# Patient Record
Sex: Female | Born: 1960
Health system: Southern US, Community
[De-identification: ages and names within clinical notes are randomized; demographics above are authoritative.]

## PROBLEM LIST (undated history)

## (undated) DIAGNOSIS — C50919 Malignant neoplasm of unspecified site of unspecified female breast: Secondary | ICD-10-CM

## (undated) DIAGNOSIS — J3800 Paralysis of vocal cords and larynx, unspecified: Secondary | ICD-10-CM

## (undated) HISTORY — DX: Paralysis of vocal cords and larynx, unspecified: J38.00

## (undated) HISTORY — PX: ROTATOR CUFF REPAIR: SHX139

## (undated) HISTORY — PX: APPENDECTOMY: SHX54

## (undated) HISTORY — PX: MASTECTOMY: SHX3

## (undated) HISTORY — DX: Malignant neoplasm of unspecified site of unspecified female breast: C50.919

---

## 1993-11-19 HISTORY — PX: ABDOMINAL HYSTERECTOMY: SHX81

## 2016-06-27 HISTORY — PX: COLONOSCOPY: SHX174

## 2016-12-13 HISTORY — PX: COLONOSCOPY: SHX174

## 2017-10-13 LAB — HM COLONOSCOPY

## 2018-05-07 DIAGNOSIS — C50919 Malignant neoplasm of unspecified site of unspecified female breast: Secondary | ICD-10-CM | POA: Diagnosis not present

## 2018-07-04 DIAGNOSIS — Z1211 Encounter for screening for malignant neoplasm of colon: Secondary | ICD-10-CM | POA: Diagnosis not present

## 2018-07-04 DIAGNOSIS — Z114 Encounter for screening for human immunodeficiency virus [HIV]: Secondary | ICD-10-CM | POA: Diagnosis not present

## 2018-07-04 DIAGNOSIS — L509 Urticaria, unspecified: Secondary | ICD-10-CM | POA: Diagnosis not present

## 2018-07-04 DIAGNOSIS — Z1159 Encounter for screening for other viral diseases: Secondary | ICD-10-CM | POA: Diagnosis not present

## 2018-07-14 DIAGNOSIS — M79675 Pain in left toe(s): Secondary | ICD-10-CM | POA: Diagnosis not present

## 2018-07-14 DIAGNOSIS — L03032 Cellulitis of left toe: Secondary | ICD-10-CM | POA: Diagnosis not present

## 2018-07-14 DIAGNOSIS — L600 Ingrowing nail: Secondary | ICD-10-CM | POA: Diagnosis not present

## 2018-07-28 DIAGNOSIS — Z1329 Encounter for screening for other suspected endocrine disorder: Secondary | ICD-10-CM | POA: Diagnosis not present

## 2018-07-28 DIAGNOSIS — Z23 Encounter for immunization: Secondary | ICD-10-CM | POA: Diagnosis not present

## 2018-07-28 DIAGNOSIS — Z1159 Encounter for screening for other viral diseases: Secondary | ICD-10-CM | POA: Diagnosis not present

## 2018-07-28 DIAGNOSIS — Z0000 Encounter for general adult medical examination without abnormal findings: Secondary | ICD-10-CM | POA: Diagnosis not present

## 2018-07-28 DIAGNOSIS — Z833 Family history of diabetes mellitus: Secondary | ICD-10-CM | POA: Diagnosis not present

## 2018-07-28 DIAGNOSIS — Z13220 Encounter for screening for lipoid disorders: Secondary | ICD-10-CM | POA: Diagnosis not present

## 2018-12-16 DIAGNOSIS — M8580 Other specified disorders of bone density and structure, unspecified site: Secondary | ICD-10-CM | POA: Diagnosis not present

## 2018-12-16 DIAGNOSIS — Z79899 Other long term (current) drug therapy: Secondary | ICD-10-CM | POA: Diagnosis not present

## 2018-12-16 DIAGNOSIS — C50912 Malignant neoplasm of unspecified site of left female breast: Secondary | ICD-10-CM | POA: Diagnosis not present

## 2018-12-16 DIAGNOSIS — Z170 Estrogen receptor positive status [ER+]: Secondary | ICD-10-CM | POA: Diagnosis not present

## 2018-12-16 DIAGNOSIS — C50919 Malignant neoplasm of unspecified site of unspecified female breast: Secondary | ICD-10-CM | POA: Diagnosis not present

## 2018-12-17 DIAGNOSIS — Z85828 Personal history of other malignant neoplasm of skin: Secondary | ICD-10-CM | POA: Diagnosis not present

## 2018-12-17 DIAGNOSIS — Z808 Family history of malignant neoplasm of other organs or systems: Secondary | ICD-10-CM | POA: Diagnosis not present

## 2018-12-17 DIAGNOSIS — L578 Other skin changes due to chronic exposure to nonionizing radiation: Secondary | ICD-10-CM | POA: Diagnosis not present

## 2018-12-17 DIAGNOSIS — L570 Actinic keratosis: Secondary | ICD-10-CM | POA: Diagnosis not present

## 2019-01-01 DIAGNOSIS — C50919 Malignant neoplasm of unspecified site of unspecified female breast: Secondary | ICD-10-CM | POA: Diagnosis not present

## 2019-01-01 DIAGNOSIS — M8589 Other specified disorders of bone density and structure, multiple sites: Secondary | ICD-10-CM | POA: Diagnosis not present

## 2019-01-01 DIAGNOSIS — Z853 Personal history of malignant neoplasm of breast: Secondary | ICD-10-CM | POA: Diagnosis not present

## 2019-01-06 DIAGNOSIS — C50919 Malignant neoplasm of unspecified site of unspecified female breast: Secondary | ICD-10-CM | POA: Diagnosis not present

## 2019-01-06 DIAGNOSIS — M8588 Other specified disorders of bone density and structure, other site: Secondary | ICD-10-CM | POA: Diagnosis not present

## 2019-03-24 ENCOUNTER — Emergency Department
Admission: EM | Admit: 2019-03-24 | Discharge: 2019-03-24 | Disposition: A | Discharge status: Home / Self Care | Payer: BLUE CROSS/BLUE SHIELD | Attending: Emergency Medicine | Admitting: Emergency Medicine

## 2019-03-24 ENCOUNTER — Other Ambulatory Visit: Payer: Self-pay

## 2019-03-24 ENCOUNTER — Encounter: Payer: Self-pay | Admitting: Emergency Medicine

## 2019-03-24 ENCOUNTER — Emergency Department: Payer: BLUE CROSS/BLUE SHIELD

## 2019-03-24 DIAGNOSIS — S93491A Sprain of other ligament of right ankle, initial encounter: Secondary | ICD-10-CM

## 2019-03-24 DIAGNOSIS — S99921A Unspecified injury of right foot, initial encounter: Secondary | ICD-10-CM | POA: Diagnosis not present

## 2019-03-24 DIAGNOSIS — Y999 Unspecified external cause status: Secondary | ICD-10-CM | POA: Diagnosis not present

## 2019-03-24 DIAGNOSIS — X501XXA Overexertion from prolonged static or awkward postures, initial encounter: Secondary | ICD-10-CM | POA: Diagnosis not present

## 2019-03-24 DIAGNOSIS — Y929 Unspecified place or not applicable: Secondary | ICD-10-CM | POA: Diagnosis not present

## 2019-03-24 DIAGNOSIS — Y939 Activity, unspecified: Secondary | ICD-10-CM | POA: Insufficient documentation

## 2019-03-24 DIAGNOSIS — S93432A Sprain of tibiofibular ligament of left ankle, initial encounter: Secondary | ICD-10-CM | POA: Diagnosis not present

## 2019-03-24 MED ORDER — MELOXICAM 15 MG PO TABS: 15.0000 mg | ORAL_TABLET | Freq: Every day | ORAL | 0 refills | Status: DC

## 2019-03-24 NOTE — ED Notes (Signed)
See triage note  Presents with pain to right foot   Stats she tripped earlier today  Having pain across the top of foot  States she took IBU PTA and states pain has eased off slightly  But unable to bear wt

## 2019-03-24 NOTE — ED Triage Notes (Signed)
PT states she tripped outside while walking and c/o RT foot pain. No deformity noted. PT denies falling.

## 2019-03-24 NOTE — ED Provider Notes (Signed)
[facility] Emergency Department Provider Note  ____________________________________________  Time seen: Approximately 8:03 PM  I have reviewed the triage vital signs and the nursing notes.   HISTORY  Chief Complaint No chief complaint on file.    HPI Alice Cherry is a 58 y.o. female who presents the emergency department complaining of right foot pain.  Patient presented to the emergency department after rolling her ankle.  She reports that she is wearing a pair flip-flops that were worn out, she believes that she caught the end of the foot flop, rolling her ankle.  Initially, patient did not have any complaints but when she got home, she had increasing pain to the right lateral foot.  Pain became severe enough that she was unable to bear weight.  She took 800 mg of ibuprofen prior to arrival and states that at this time her foot feels much better.  No numbness or tingling.  No other injury or complaint at this time.         History reviewed. No pertinent past medical history.  There are no active problems to display for this patient.   Past Surgical History:  Procedure Laterality Date  . MASTECTOMY      Prior to Admission medications   Medication Sig Start Date End Date Taking? Authorizing Provider  meloxicam (MOBIC) 15 MG tablet Take 1 tablet (15 mg total) by mouth daily. 03/24/19   , Charline Bills, PA-C    Allergies Penicillins  No family history on file.  Social History Social History   Tobacco Use  . Smoking status: Never Smoker  . Smokeless tobacco: Never Used  Substance Use Topics  . Alcohol use: Not Currently  . Drug use: Not Currently     Review of Systems  Constitutional: No fever/chills Eyes: No visual changes. No discharge ENT: No upper respiratory complaints. Cardiovascular: no chest pain. Respiratory: no cough. No SOB. Musculoskeletal: Positive for right foot pain/injury Skin: Negative for rash, abrasions,  lacerations, ecchymosis. Neurological: Negative for headaches, focal weakness or numbness. 10-point ROS otherwise negative.  ____________________________________________   PHYSICAL EXAM:  VITAL SIGNS: ED Triage Vitals  Enc Vitals Group     BP 03/24/19 1844 119/66     Pulse Rate 03/24/19 1844 76     Resp 03/24/19 1844 20     Temp 03/24/19 1844 97.8 F (36.6 C)     Temp Source 03/24/19 1844 Oral     SpO2 03/24/19 1844 96 %     Weight --      Height --      Head Circumference --      Peak Flow --      Pain Score 03/24/19 1845 8     Pain Loc --      Pain Edu? --      Excl. in [place]? --      Constitutional: Alert and oriented. Well appearing and in no acute distress. Eyes: Conjunctivae are normal. PERRL. EOMI. Head: Atraumatic. Neck: No stridor.    Cardiovascular: Normal rate, regular rhythm. Normal S1 and S2.  Good peripheral circulation. Respiratory: Normal respiratory effort without tachypnea or retractions. Lungs CTAB. Good air entry to the bases with no decreased or absent breath sounds. Musculoskeletal: Full range of motion to all extremities. No gross deformities appreciated.  Visualization of the right foot reveals mild edema to the lateral aspect over the fourth and fifth metatarsal head.  No laceration, abrasions, ecchymosis.  Patient is able to flex and extend the foot appropriately  at this time.  Mild tenderness to palpation of the proximal fourth and fifth metatarsal with no palpable abnormality or deficit.  Sensation intact all 5 digits.  Capillary refill less than 2 seconds all digits. Neurologic:  Normal speech and language. No gross focal neurologic deficits are appreciated.  Skin:  Skin is warm, dry and intact. No rash noted. Psychiatric: Mood and affect are normal. Speech and behavior are normal. Patient exhibits appropriate insight and judgement.   ____________________________________________   LABS (all labs ordered are listed, but only abnormal results are  displayed)  Labs Reviewed - No data to display ____________________________________________  EKG   ____________________________________________  RADIOLOGY I personally viewed and evaluated these images as part of my medical decision making, as well as reviewing the written report by the radiologist.  Dg Foot Complete Right  Result Date: 03/24/2019 CLINICAL DATA:  Fall EXAM: RIGHT FOOT COMPLETE - 3+ VIEW COMPARISON:  None FINDINGS: There is no evidence of fracture or dislocation. There is no evidence of arthropathy or other focal bone abnormality. Soft tissues are unremarkable. IMPRESSION: Negative. Electronically Signed   By: Rolm Baptise M.D.   On: 03/24/2019 19:35    ____________________________________________    PROCEDURES  Procedure(s) performed:    Procedures    Medications - No data to display   ____________________________________________   INITIAL IMPRESSION / ASSESSMENT AND PLAN / ED COURSE  Pertinent labs & imaging results that were available during my care of the patient were reviewed by me and considered in my medical decision making (see chart for details).  Review of the [place] CSRS was performed in accordance of the [place] prior to dispensing any controlled drugs.           Patient's diagnosis is consistent with sprain right anterior talofibular ligament.  Patient presents emergency department with complaint of pain to the right foot after injury while walking.  Exam was reassuring.  No evidence of fracture on imaging.  Follow-up with orthopedics if needed.  Patient will be prescribed meloxicam.  Her ankle is wrapped with Ace bandage and she is given crutches for ambulation. Patient is given ED precautions to return to the ED for any worsening or new symptoms.     ____________________________________________  FINAL CLINICAL IMPRESSION(S) / ED DIAGNOSES  Final diagnoses:  Sprain of anterior talofibular ligament of right ankle, initial encounter       NEW MEDICATIONS STARTED DURING THIS VISIT:  ED Discharge Orders         Ordered    meloxicam (MOBIC) 15 MG tablet  Daily     03/24/19 2019              This chart was dictated using voice recognition software/Dragon. Despite best efforts to proofread, errors can occur which can change the meaning. Any change was purely unintentional.    Darletta Moll, PA-C 03/24/19 2020    Nance Pear, MD 03/24/19 2212

## 2019-06-16 DIAGNOSIS — M8580 Other specified disorders of bone density and structure, unspecified site: Secondary | ICD-10-CM | POA: Diagnosis not present

## 2019-06-16 DIAGNOSIS — C50912 Malignant neoplasm of unspecified site of left female breast: Secondary | ICD-10-CM | POA: Diagnosis not present

## 2019-06-16 DIAGNOSIS — C50919 Malignant neoplasm of unspecified site of unspecified female breast: Secondary | ICD-10-CM | POA: Diagnosis not present

## 2019-06-16 DIAGNOSIS — Z9012 Acquired absence of left breast and nipple: Secondary | ICD-10-CM | POA: Diagnosis not present

## 2019-06-26 DIAGNOSIS — H1030 Unspecified acute conjunctivitis, unspecified eye: Secondary | ICD-10-CM | POA: Diagnosis not present

## 2019-07-21 DIAGNOSIS — Z20828 Contact with and (suspected) exposure to other viral communicable diseases: Secondary | ICD-10-CM | POA: Diagnosis not present

## 2019-09-07 DIAGNOSIS — Z20828 Contact with and (suspected) exposure to other viral communicable diseases: Secondary | ICD-10-CM | POA: Diagnosis not present

## 2019-11-09 DIAGNOSIS — D225 Melanocytic nevi of trunk: Secondary | ICD-10-CM | POA: Diagnosis not present

## 2019-11-09 DIAGNOSIS — D485 Neoplasm of uncertain behavior of skin: Secondary | ICD-10-CM | POA: Diagnosis not present

## 2019-11-09 DIAGNOSIS — D2261 Melanocytic nevi of right upper limb, including shoulder: Secondary | ICD-10-CM | POA: Diagnosis not present

## 2019-11-09 DIAGNOSIS — C44529 Squamous cell carcinoma of skin of other part of trunk: Secondary | ICD-10-CM | POA: Diagnosis not present

## 2019-11-09 DIAGNOSIS — D2262 Melanocytic nevi of left upper limb, including shoulder: Secondary | ICD-10-CM | POA: Diagnosis not present

## 2019-11-09 DIAGNOSIS — D2272 Melanocytic nevi of left lower limb, including hip: Secondary | ICD-10-CM | POA: Diagnosis not present

## 2019-12-03 DIAGNOSIS — K529 Noninfective gastroenteritis and colitis, unspecified: Secondary | ICD-10-CM | POA: Diagnosis not present

## 2019-12-03 DIAGNOSIS — Z1152 Encounter for screening for COVID-19: Secondary | ICD-10-CM | POA: Diagnosis not present

## 2019-12-22 DIAGNOSIS — C44529 Squamous cell carcinoma of skin of other part of trunk: Secondary | ICD-10-CM | POA: Diagnosis not present

## 2020-01-27 ENCOUNTER — Ambulatory Visit (INDEPENDENT_AMBULATORY_CARE_PROVIDER_SITE_OTHER): Payer: BC Managed Care – PPO | Admitting: Primary Care

## 2020-01-27 ENCOUNTER — Encounter: Payer: Self-pay | Admitting: Primary Care

## 2020-01-27 ENCOUNTER — Other Ambulatory Visit: Payer: Self-pay

## 2020-01-27 VITALS — BP 120/78 | HR 82 | Temp 96.5°F | Ht 67.5 in | Wt 173.2 lb

## 2020-01-27 DIAGNOSIS — F4321 Adjustment disorder with depressed mood: Secondary | ICD-10-CM

## 2020-01-27 DIAGNOSIS — F5105 Insomnia due to other mental disorder: Secondary | ICD-10-CM

## 2020-01-27 HISTORY — DX: Adjustment disorder with depressed mood: F43.21

## 2020-01-27 MED ORDER — FLUOXETINE HCL 20 MG PO TABS: 20.0000 mg | ORAL_TABLET | Freq: Every day | ORAL | 1 refills | Status: DC

## 2020-01-27 NOTE — Assessment & Plan Note (Signed)
Acute and secondary to the unexpected loss of her young son in late January 2021. Discussed options for treatment including therapy vs medication.  She kindly declines therapy as she feels she has a good support system. Rx for fluoxetine 20 mg sent to pharmacy. Patient is to take 1/2 tablet daily for 8 days, then advance to 1 full tablet thereafter. We discussed possible side effects of headache, GI upset, drowsiness, and SI/HI. If thoughts of SI/HI develop, we discussed to present to the emergency immediately. Patient verbalized understanding.   Follow up in 6 weeks for re-evaluation.

## 2020-01-27 NOTE — Assessment & Plan Note (Signed)
Acute since the unexpected loss of her young son in late January 2021.   Zzzquil will interact with fluoxetine so I recommended either Melatonin or Unisom.   Consider low dose Trazodone if needed.

## 2020-01-27 NOTE — Progress Notes (Signed)
Subjective:    Patient ID: Alice Cherry, female    DOB: 12/22/1960, 59 y.o.   MRN: [id number]  HPI  This visit occurred during the SARS-CoV-2 public health emergency.  Safety protocols were in place, including screening questions prior to the visit, additional usage of staff PPE, and extensive cleaning of exam room while observing appropriate contact time as indicated for disinfecting solutions.   Ms. Alice Cherry is a 59 year old female who presents today to establish care and discuss the problems mentioned below. Will obtain/review records.  1) Breast Cancer: Diagnosed to the left breast in 2017, mastectomy in 2017, following with oncology through [facility]. Completes annual mammograms. Never underwent chemotherapy or radiation. Has a complication of left sided lymphadenopathy.   2) Osteopenia: Currently managed on Calcium and Vitamin D. Last bone density scan was several years ago through [place].   BP Readings from Last 3 Encounters:  01/27/20 120/78  03/24/19 119/66   3) Adjustment reaction with Depression: Acute since the unexpected loss of her son in January 2021. Symptoms include tearfulness, little motivation to get out of bed, having difficulty getting work done, being more absent minded, fatigue, having trouble making dinner, insomnia. She is taking Zzzquil nightly to stay asleep. She has no trouble falling asleep.   No prior history of anxiety and depression. She has an excellent support system with her husband and close friends. Not currently following with therapy, no current medication treatment.   Review of Systems  Respiratory: Negative for shortness of breath.   Cardiovascular: Negative for chest pain.  Psychiatric/Behavioral: Positive for sleep disturbance. Negative for suicidal ideas. The patient is not nervous/anxious.        See HPI       No past medical history on file.   Social History   Socioeconomic History  . Marital status: Married    Spouse name: Not on file  .  Number of children: Not on file  . Years of education: Not on file  . Highest education level: Not on file  Occupational History  . Not on file  Tobacco Use  . Smoking status: Never Smoker  . Smokeless tobacco: Never Used  Substance and Sexual Activity  . Alcohol use: Not Currently  . Drug use: Not Currently  . Sexual activity: Not on file  Other Topics Concern  . Not on file  Social History Narrative  . Not on file   Social Determinants of Health   Financial Resource Strain:   . Difficulty of Paying Living Expenses: Not on file  Food Insecurity:   . Worried About Charity fundraiser in the Last Year: Not on file  . Ran Out of Food in the Last Year: Not on file  Transportation Needs:   . Lack of Transportation (Medical): Not on file  . Lack of Transportation (Non-Medical): Not on file  Physical Activity:   . Days of Exercise per Week: Not on file  . Minutes of Exercise per Session: Not on file  Stress:   . Feeling of Stress : Not on file  Social Connections:   . Frequency of Communication with Friends and Family: Not on file  . Frequency of Social Gatherings with Friends and Family: Not on file  . Attends Religious Services: Not on file  . Active Member of Clubs or Organizations: Not on file  . Attends Archivist Meetings: Not on file  . Marital Status: Not on file  Intimate Partner Violence:   . Fear of  Current or Ex-Partner: Not on file  . Emotionally Abused: Not on file  . Physically Abused: Not on file  . Sexually Abused: Not on file    Past Surgical History:  Procedure Laterality Date  . ABDOMINAL HYSTERECTOMY  1995  . APPENDECTOMY    . MASTECTOMY      Family History  Problem Relation Age of Onset  . Diabetes Mother   . Alzheimer's disease Mother   . Skin cancer Father   . Cancer Father        multiple myeloma    Allergies  Allergen Reactions  . Penicillins     No current outpatient medications on file prior to visit.   No current  facility-administered medications on file prior to visit.    BP 120/78   Pulse 82   Temp (!) 96.5 F (35.8 C) (Temporal)   Ht 5' 7.5" (1.715 m)   Wt 173 lb 4 oz (78.6 kg)   SpO2 98%   BMI 26.73 kg/m    Objective:   Physical Exam  Constitutional: She appears well-nourished.  Cardiovascular: Normal rate and regular rhythm.  Respiratory: Effort normal and breath sounds normal.  Musculoskeletal:     Cervical back: Neck supple.  Skin: Skin is warm and dry.  Psychiatric: She has a normal mood and affect.           Assessment & Plan:

## 2020-01-27 NOTE — Patient Instructions (Addendum)
Start fluoxetine (Prozac) 20 mg tablets. Start by taking 1/2 tablet daily for 6-8 days, then increase to 1 full tablet thereafter.   Consider trying Unisom or Melatonin (max dose 10 mg) for sleep.  Please schedule a follow up visit for 6 weeks as discussed.   It was a pleasure to meet you today! Please don't hesitate to call or message me with any questions. Welcome to [facility]!

## 2020-02-26 DIAGNOSIS — Z79899 Other long term (current) drug therapy: Secondary | ICD-10-CM | POA: Diagnosis not present

## 2020-02-26 DIAGNOSIS — Z9012 Acquired absence of left breast and nipple: Secondary | ICD-10-CM | POA: Diagnosis not present

## 2020-02-26 DIAGNOSIS — Z08 Encounter for follow-up examination after completed treatment for malignant neoplasm: Secondary | ICD-10-CM | POA: Diagnosis not present

## 2020-02-26 DIAGNOSIS — M8589 Other specified disorders of bone density and structure, multiple sites: Secondary | ICD-10-CM | POA: Diagnosis not present

## 2020-02-26 DIAGNOSIS — Z853 Personal history of malignant neoplasm of breast: Secondary | ICD-10-CM | POA: Diagnosis not present

## 2020-02-26 DIAGNOSIS — C50919 Malignant neoplasm of unspecified site of unspecified female breast: Secondary | ICD-10-CM | POA: Diagnosis not present

## 2020-03-09 ENCOUNTER — Ambulatory Visit (INDEPENDENT_AMBULATORY_CARE_PROVIDER_SITE_OTHER): Payer: BC Managed Care – PPO | Admitting: Primary Care

## 2020-03-09 ENCOUNTER — Other Ambulatory Visit: Payer: Self-pay

## 2020-03-09 ENCOUNTER — Encounter: Payer: Self-pay | Admitting: Primary Care

## 2020-03-09 DIAGNOSIS — F5105 Insomnia due to other mental disorder: Secondary | ICD-10-CM

## 2020-03-09 DIAGNOSIS — F4321 Adjustment disorder with depressed mood: Secondary | ICD-10-CM | POA: Diagnosis not present

## 2020-03-09 NOTE — Assessment & Plan Note (Signed)
Improved on fluoxetine, could not tolerate 20 mg dose due to feeling too jittery, but crashing in the afternoon.  Recommended to try fluoxetine 10 mg BID. She will try this for a few weeks and then update via My Chart.  Consider switching to Lexapro if needed.

## 2020-03-09 NOTE — Progress Notes (Signed)
Subjective:    Patient ID: Alice Cherry, female    DOB: September 03, 1961, 59 y.o.   MRN: [id number]  HPI  This visit occurred during the SARS-CoV-2 public health emergency.  Safety protocols were in place, including screening questions prior to the visit, additional usage of staff PPE, and extensive cleaning of exam room while observing appropriate contact time as indicated for disinfecting solutions.   Alice Cherry is a 59 year old female who presents today for follow up of acute depression. She was last evaluated on 01/27/20 as a new patient with reports of acute symptoms of depression since the unexpected loss of her son in January 2021. No prior history of anxiety or depression. Given her symptoms and inability to manage we initiated fluoxetine 20 mg. We also recommended Melatonin or Unisom for sleeping.   Since her last visit she's feeling better. Positive effects of fluoxetine include increased energy, better control of emotions. She could not tolerate the full 20 mg tablet of fluoxetine due to "feeling intense/jittery", so she is taking 1/2 tablet and doing better. She did go without the medication for 3 days and realized that she felt better on the smaller dose.   She has noticed that she is "crashing" during the afternoon, and will come home very tired. Given this she is not taking anything OTC for insomnia as her sleeping is better. Takes Unisom once weekly if needed.  BP Readings from Last 3 Encounters:  03/09/20 122/80  01/27/20 120/78  03/24/19 119/66     Review of Systems  Cardiovascular: Negative for chest pain.  Neurological: Negative for dizziness and headaches.  Psychiatric/Behavioral:       See HPI       No past medical history on file.   Social History   Socioeconomic History  . Marital status: Married    Spouse name: Not on file  . Number of children: Not on file  . Years of education: Not on file  . Highest education level: Not on file  Occupational History  .  Not on file  Tobacco Use  . Smoking status: Never Smoker  . Smokeless tobacco: Never Used  Substance and Sexual Activity  . Alcohol use: Not Currently  . Drug use: Not Currently  . Sexual activity: Not on file  Other Topics Concern  . Not on file  Social History Narrative  . Not on file   Social Determinants of Health   Financial Resource Strain:   . Difficulty of Paying Living Expenses:   Food Insecurity:   . Worried About Charity fundraiser in the Last Year:   . Arboriculturist in the Last Year:   Transportation Needs:   . Film/video editor (Medical):   Marland Kitchen Lack of Transportation (Non-Medical):   Physical Activity:   . Days of Exercise per Week:   . Minutes of Exercise per Session:   Stress:   . Feeling of Stress :   Social Connections:   . Frequency of Communication with Friends and Family:   . Frequency of Social Gatherings with Friends and Family:   . Attends Religious Services:   . Active Member of Clubs or Organizations:   . Attends Archivist Meetings:   Marland Kitchen Marital Status:   Intimate Partner Violence:   . Fear of Current or Ex-Partner:   . Emotionally Abused:   Marland Kitchen Physically Abused:   . Sexually Abused:     Past Surgical History:  Procedure Laterality Date  .  ABDOMINAL HYSTERECTOMY  1995  . APPENDECTOMY    . MASTECTOMY      Family History  Problem Relation Age of Onset  . Diabetes Mother   . Alzheimer's disease Mother   . Skin cancer Father   . Cancer Father        multiple myeloma    Allergies  Allergen Reactions  . Penicillins     Current Outpatient Medications on File Prior to Visit  Medication Sig Dispense Refill  . FLUoxetine (PROZAC) 20 MG tablet Take 1 tablet (20 mg total) by mouth daily. (Patient taking differently: Take 10 mg by mouth daily. ) 30 tablet 1   No current facility-administered medications on file prior to visit.    BP 122/80   Pulse 73   Temp (!) 96.1 F (35.6 C) (Temporal)   SpO2 98%    Objective:     Physical Exam  Constitutional: She appears well-nourished.  Cardiovascular: Normal rate and regular rhythm.  Respiratory: Effort normal and breath sounds normal.  Musculoskeletal:     Cervical back: Neck supple.  Skin: Skin is warm and dry.  Psychiatric: She has a normal mood and affect.           Assessment & Plan:

## 2020-03-09 NOTE — Assessment & Plan Note (Signed)
Improved and using Unisom infrequently. Continue to monitor.

## 2020-03-09 NOTE — Patient Instructions (Signed)
Try taking fluoxetine 1/2 tablet twice daily as discussed.  Please update me via My Chart in 2-3 weeks.  It was a pleasure to see you today!

## 2020-03-22 ENCOUNTER — Other Ambulatory Visit: Payer: Self-pay | Admitting: Primary Care

## 2020-03-22 DIAGNOSIS — F4321 Adjustment disorder with depressed mood: Secondary | ICD-10-CM

## 2020-03-24 NOTE — Telephone Encounter (Signed)
Message left for patient to return my call.  

## 2020-03-25 NOTE — Telephone Encounter (Signed)
Left message on patient's voicemail to return call

## 2020-03-30 NOTE — Telephone Encounter (Signed)
Please advise. It has been quite a few days and patient have not reply or call office with an update

## 2020-03-30 NOTE — Telephone Encounter (Signed)
Refill provided for 10 mg BID. Also made note in the Rx for pharmacy to have patient contact us.

## 2020-03-30 NOTE — Telephone Encounter (Signed)
Message left for patient to return my call.  

## 2020-06-05 DIAGNOSIS — X501XXA Overexertion from prolonged static or awkward postures, initial encounter: Secondary | ICD-10-CM | POA: Diagnosis not present

## 2020-06-05 DIAGNOSIS — S93402A Sprain of unspecified ligament of left ankle, initial encounter: Secondary | ICD-10-CM | POA: Diagnosis not present

## 2020-06-05 DIAGNOSIS — Y9301 Activity, walking, marching and hiking: Secondary | ICD-10-CM | POA: Diagnosis not present

## 2020-06-05 DIAGNOSIS — S96912A Strain of unspecified muscle and tendon at ankle and foot level, left foot, initial encounter: Secondary | ICD-10-CM | POA: Diagnosis not present

## 2020-06-05 DIAGNOSIS — G35 Multiple sclerosis: Secondary | ICD-10-CM | POA: Diagnosis not present

## 2020-08-17 ENCOUNTER — Telehealth: Payer: Self-pay | Admitting: Primary Care

## 2020-08-17 NOTE — Telephone Encounter (Signed)
Patient unavailable left vm for her to call back to schedule a virtual visit for her refill on her medication.

## 2020-08-23 ENCOUNTER — Other Ambulatory Visit: Payer: Self-pay | Admitting: Primary Care

## 2020-08-23 DIAGNOSIS — F4321 Adjustment disorder with depressed mood: Secondary | ICD-10-CM

## 2020-08-25 NOTE — Telephone Encounter (Signed)
We have been calling several times and sent note on last script to make appointment not able to reach do you want to refill?

## 2020-08-29 ENCOUNTER — Telehealth (INDEPENDENT_AMBULATORY_CARE_PROVIDER_SITE_OTHER): Payer: BC Managed Care – PPO | Admitting: Primary Care

## 2020-08-29 ENCOUNTER — Other Ambulatory Visit: Payer: Self-pay

## 2020-08-29 ENCOUNTER — Encounter: Payer: Self-pay | Admitting: Primary Care

## 2020-08-29 DIAGNOSIS — F4321 Adjustment disorder with depressed mood: Secondary | ICD-10-CM | POA: Diagnosis not present

## 2020-08-29 NOTE — Assessment & Plan Note (Signed)
Agree to resume fluoxetine, but need clarification on which dose she's actually taking. We've gone between the 10 and 20 mg dose, so I asked her to send a message via my chart with the exact dose she's been taking.  Will send refills once we hear from her.

## 2020-08-29 NOTE — Progress Notes (Signed)
Subjective:    Patient ID: Alice Cherry, female    DOB: 05-10-1961, 59 y.o.   MRN: [id number]  HPI  Virtual Visit via Video Note  I connected with Alice Cherry on 08/29/20 at  2:00 PM EDT by a video enabled telemedicine application and verified that I am speaking with the correct person using two identifiers.  Location: Patient: Work Provider: Office   I discussed the limitations of evaluation and management by telemedicine and the availability of in person appointments. The patient expressed understanding and agreed to proceed.  History of Present Illness:  Alice Cherry is a 59 year old female who presents today for medication refill.  She is currently managed on fluoxetine 20 mg for symptoms of anxiety, insomnia, and depression since then unexpected loss of her son last year.   She was originally treated for her symptoms in early March 2021 with her last follow up being in April 2021. She sent a my chart message last week with reports of stopping her fluoxetine in June this year as she felt better, but feels that she may need to resume.  Symptoms consist of tearfulness, weight gain, inability to complete tasks, fatigue, difficulty coping.   Two weeks ago she resumed her fluoxetine medication, start taking "one half dose" in the morning, then increased to "one half dose" in the morning and evening. Now she's up to one "full dose" in the morning and one "half dose" in the evening and is doing much better.  She would like to resume at this regimen. She's unsure of the dose amount of her fluoxetine tablet.    Observations/Objective:  Alert and oriented. Appears well, not sickly. No distress. Speaking in complete sentences.   Assessment and Plan:  Agree to resume fluoxetine, but need clarification on which dose she's actually taking. We've gone between the 10 and 20 mg dose, so I asked her to send a message via my chart with the exact dose she's been taking.  Will send refills  once we hear from her.  Follow Up Instructions:  Please send me information regarding the dose of your fluoxetine medication.  It was a pleasure to see you today!    I discussed the assessment and treatment plan with the patient. The patient was provided an opportunity to ask questions and all were answered. The patient agreed with the plan and demonstrated an understanding of the instructions.   The patient was advised to call back or seek an in-person evaluation if the symptoms worsen or if the condition fails to improve as anticipated.    Pleas Koch, NP    Review of Systems  Gastrointestinal: Negative for nausea.  Neurological: Negative for headaches.  Psychiatric/Behavioral:       See HPI       History reviewed. No pertinent past medical history.   Social History   Socioeconomic History   Marital status: Married    Spouse name: Not on file   Number of children: Not on file   Years of education: Not on file   Highest education level: Not on file  Occupational History   Not on file  Tobacco Use   Smoking status: Never Smoker   Smokeless tobacco: Never Used  Substance and Sexual Activity   Alcohol use: Not Currently   Drug use: Not Currently   Sexual activity: Not on file  Other Topics Concern   Not on file  Social History Narrative   Not on file   Social Determinants  of Health   Financial Resource Strain:    Difficulty of Paying Living Expenses: Not on file  Food Insecurity:    Worried About [place] in the Last Year: Not on file   Ran Out of Food in the Last Year: Not on file  Transportation Needs:    Lack of Transportation (Medical): Not on file   Lack of Transportation (Non-Medical): Not on file  Physical Activity:    Days of Exercise per Week: Not on file   Minutes of Exercise per Session: Not on file  Stress:    Feeling of Stress : Not on file  Social Connections:    Frequency of Communication with  Friends and Family: Not on file   Frequency of Social Gatherings with Friends and Family: Not on file   Attends Religious Services: Not on file   Active Member of Clubs or Organizations: Not on file   Attends Archivist Meetings: Not on file   Marital Status: Not on file  Intimate Partner Violence:    Fear of Current or Ex-Partner: Not on file   Emotionally Abused: Not on file   Physically Abused: Not on file   Sexually Abused: Not on file    Past Surgical History:  Procedure Laterality Date   ABDOMINAL HYSTERECTOMY  1995   APPENDECTOMY     MASTECTOMY      Family History  Problem Relation Age of Onset   Diabetes Mother    Alzheimer's disease Mother    Skin cancer Father    Cancer Father        multiple myeloma    Allergies  Allergen Reactions   Penicillins     Current Outpatient Medications on File Prior to Visit  Medication Sig Dispense Refill   FLUoxetine (PROZAC) 20 MG tablet Take 0.5 tablets (10 mg total) by mouth 2 (two) times daily. 30 tablet 0   No current facility-administered medications on file prior to visit.    Ht 5' 7.5" (1.715 m)    Wt 175 lb (79.4 kg)    BMI 27.00 kg/m    Objective:   Physical Exam Pulmonary:     Effort: Pulmonary effort is normal.  Neurological:     Mental Status: She is alert and oriented to person, place, and time.  Psychiatric:        Mood and Affect: Mood normal.     Comments: Smiling during video visit            Assessment & Plan:

## 2020-08-29 NOTE — Patient Instructions (Signed)
Please send me information regarding the dose of your fluoxetine medication.  It was a pleasure to see you today!

## 2020-08-30 DIAGNOSIS — F4321 Adjustment disorder with depressed mood: Secondary | ICD-10-CM

## 2020-08-30 NOTE — Telephone Encounter (Signed)
I met with her via video this week, and asked her to verify the dose of fluoxetine that she has at home.  She has been on 10 mg in the past, as well as 20 mg.  Please verify the dose and how frequently she is taking. We will need to send refills of what she is currently taking.  90-day supply with 1 refill. 

## 2020-08-31 NOTE — Telephone Encounter (Signed)
Per my chart message she is taking 20 mg in am and 10-20 at night I have called in script for that.

## 2020-09-07 DIAGNOSIS — Z853 Personal history of malignant neoplasm of breast: Secondary | ICD-10-CM | POA: Diagnosis not present

## 2020-09-07 DIAGNOSIS — C50919 Malignant neoplasm of unspecified site of unspecified female breast: Secondary | ICD-10-CM | POA: Diagnosis not present

## 2020-09-07 DIAGNOSIS — Z9012 Acquired absence of left breast and nipple: Secondary | ICD-10-CM | POA: Diagnosis not present

## 2020-09-07 DIAGNOSIS — M8589 Other specified disorders of bone density and structure, multiple sites: Secondary | ICD-10-CM | POA: Diagnosis not present

## 2020-09-07 DIAGNOSIS — Z08 Encounter for follow-up examination after completed treatment for malignant neoplasm: Secondary | ICD-10-CM | POA: Diagnosis not present

## 2020-11-27 ENCOUNTER — Other Ambulatory Visit: Payer: Self-pay | Admitting: Primary Care

## 2020-11-27 DIAGNOSIS — F4321 Adjustment disorder with depressed mood: Secondary | ICD-10-CM

## 2020-12-12 ENCOUNTER — Other Ambulatory Visit: Payer: Self-pay | Admitting: Primary Care

## 2020-12-12 DIAGNOSIS — F4321 Adjustment disorder with depressed mood: Secondary | ICD-10-CM

## 2020-12-12 NOTE — Telephone Encounter (Signed)
It looks like this medication was ordered by a Heloise Beecham on 11/28/20, she ordered a 74-month refill so why  am I getting a refill request?

## 2020-12-14 NOTE — Telephone Encounter (Signed)
Pharmacy requests refill on: Fluoxetine 20 mg   LAST REFILL: 11/28/2020 (Q-60, R-2) LAST OV: 08/29/2020 NEXT OV: Not Scheduled  PHARMACY: [facility] [place], [place]

## 2021-02-02 ENCOUNTER — Ambulatory Visit (INDEPENDENT_AMBULATORY_CARE_PROVIDER_SITE_OTHER): Payer: BC Managed Care – PPO | Admitting: Primary Care

## 2021-02-02 ENCOUNTER — Other Ambulatory Visit: Payer: Self-pay

## 2021-02-02 ENCOUNTER — Ambulatory Visit (INDEPENDENT_AMBULATORY_CARE_PROVIDER_SITE_OTHER)
Admission: RE | Admit: 2021-02-02 | Discharge: 2021-02-02 | Disposition: A | Discharge status: Home / Self Care | Payer: BC Managed Care – PPO | Source: Ambulatory Visit | Attending: Primary Care | Admitting: Primary Care

## 2021-02-02 ENCOUNTER — Encounter: Payer: Self-pay | Admitting: Primary Care

## 2021-02-02 VITALS — BP 132/74 | HR 72 | Temp 98.7°F | Ht 67.5 in | Wt 185.0 lb

## 2021-02-02 DIAGNOSIS — M79602 Pain in left arm: Secondary | ICD-10-CM | POA: Insufficient documentation

## 2021-02-02 DIAGNOSIS — M25512 Pain in left shoulder: Secondary | ICD-10-CM | POA: Diagnosis not present

## 2021-02-02 HISTORY — DX: Pain in left arm: M79.602

## 2021-02-02 MED ORDER — PREDNISONE 20 MG PO TABS: ORAL_TABLET | ORAL | 0 refills | Status: DC

## 2021-02-02 MED ORDER — CYCLOBENZAPRINE HCL 5 MG PO TABS: 5.0000 mg | ORAL_TABLET | Freq: Every evening | ORAL | 0 refills | Status: DC | PRN

## 2021-02-02 NOTE — Assessment & Plan Note (Signed)
Acute for the last 3 weeks, no improvement with conservative home treatment.  Exam today with normal ROM to neck and left upper extremity.  Unclear etiology, could still be cervical versus shoulder.  Prescription for prednisone course 20 mg sent to pharmacy.  Prescription for Flexeril 5 mg to take at bedtime sent pharmacy.  Plain films of the left shoulder pending today. Discussed physical therapy versus sports medicine evaluation.   She may need cervical plain films, MRI (shoulder?). Await results. She will update regarding medications.

## 2021-02-02 NOTE — Patient Instructions (Signed)
Start prednisone tablets. Take two tablets my mouth once daily for three days, then one tablet once daily for three days.   You may take the cyclobenzaprine (Flexeril) 5 mg muscle relaxer at bedtime as needed.  Complete xray(s) prior to leaving today. I will notify you of your results once received.  It was a pleasure to see you today!

## 2021-02-02 NOTE — Progress Notes (Signed)
Subjective:    Patient ID: Alice Cherry, female    DOB: 30-Jun-1961, 60 y.o.   MRN: [id number]  HPI  Alice Cherry is a very pleasant 60 y.o. female with a history of right rotator cuff tear who presents today to discuss extremity pain.  He pain is located to the entire left upper extremity, no pinpoint origin, with radiation down to her left 4th and 5th digits. Throughout the day she'll notice numbness which extends down to the left 4th and 5th digits. She describes her pain as "achy" which progresses throughout the day. By the end of the day she's in significant pain. She will wake during the night with left posterior shoulder pain. 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   She's been taking OTC Motrin, naproxen for the last three weeks without improvement. She contracted Covid-19 three weeks ago, mild course, is unsure if Covid-19 has triggered symptoms.   She denies left upper extremity weakness, trauma/injury, overuse, reduction in ROM, neck pain. History of partial rotator cuff repair to right shoulder several years ago.    Review of Systems  Musculoskeletal: Positive for arthralgias. Negative for joint swelling.  Neurological: Positive for numbness. Negative for weakness.         History reviewed. No pertinent past medical history.  Social History   Socioeconomic History  . Marital status: Married    Spouse name: Not on file  . Number of children: Not on file  . Years of education: Not on file  . Highest education level: Not on file  Occupational History  . Not on file  Tobacco Use  . Smoking status: Never Smoker  . Smokeless tobacco: Never Used  Substance and Sexual Activity  . Alcohol use: Not Currently  . Drug use: Not Currently  . Sexual activity: Not on file  Other Topics Concern  . Not on file  Social History Narrative  . Not on file   Social Determinants of Health   Financial Resource Strain: Not  on file  Food Insecurity: Not on file  Transportation Needs: Not on file  Physical Activity: Not on file  Stress: Not on file  Social Connections: Not on file  Intimate Partner Violence: Not on file    Past Surgical History:  Procedure Laterality Date  . ABDOMINAL HYSTERECTOMY  1995  . APPENDECTOMY    . MASTECTOMY      Family History  Problem Relation Age of Onset  . Diabetes Mother   . Alzheimer's disease Mother   . Skin cancer Father   . Cancer Father        multiple myeloma    Allergies  Allergen Reactions  . Penicillins     Current Outpatient Medications on File Prior to Visit  Medication Sig Dispense Refill  . FLUoxetine (PROZAC) 20 MG capsule Take 1 capsule (20 mg total) by mouth 2 (two) times daily. 60 capsule 2   No current facility-administered medications on file prior to visit.    BP 132/74   Pulse 72   Temp 98.7 F (37.1 C) (Temporal)   Ht 5' 7.5" (1.715 m)   Wt 185 lb (83.9 kg)   SpO2 98%   BMI 28.55 kg/m  Objective:   Physical Exam Constitutional:      General: She is not in acute distress. Pulmonary:     Effort: Pulmonary effort is normal.  Musculoskeletal:     Left shoulder: No deformity or bony tenderness.  Normal range of motion. Normal strength.     Comments: 5/5 strength to bilateral upper extremities.   Neurological:     Mental Status: She is alert.           Assessment & Plan:      This visit occurred during the SARS-CoV-2 public health emergency.  Safety protocols were in place, including screening questions prior to the visit, additional usage of staff PPE, and extensive cleaning of exam room while observing appropriate contact time as indicated for disinfecting solutions.

## 2021-03-09 ENCOUNTER — Ambulatory Visit (INDEPENDENT_AMBULATORY_CARE_PROVIDER_SITE_OTHER): Payer: BC Managed Care – PPO | Admitting: Primary Care

## 2021-03-09 ENCOUNTER — Other Ambulatory Visit: Payer: Self-pay

## 2021-03-09 ENCOUNTER — Encounter: Payer: Self-pay | Admitting: Primary Care

## 2021-03-09 ENCOUNTER — Ambulatory Visit (INDEPENDENT_AMBULATORY_CARE_PROVIDER_SITE_OTHER)
Admission: RE | Admit: 2021-03-09 | Discharge: 2021-03-09 | Disposition: A | Discharge status: Home / Self Care | Payer: BC Managed Care – PPO | Source: Ambulatory Visit | Attending: Primary Care | Admitting: Primary Care

## 2021-03-09 VITALS — BP 128/68 | HR 62 | Temp 98.0°F | Ht 67.75 in | Wt 182.0 lb

## 2021-03-09 DIAGNOSIS — Z23 Encounter for immunization: Secondary | ICD-10-CM | POA: Diagnosis not present

## 2021-03-09 DIAGNOSIS — F5105 Insomnia due to other mental disorder: Secondary | ICD-10-CM | POA: Diagnosis not present

## 2021-03-09 DIAGNOSIS — Z0001 Encounter for general adult medical examination with abnormal findings: Secondary | ICD-10-CM | POA: Diagnosis not present

## 2021-03-09 DIAGNOSIS — M795 Residual foreign body in soft tissue: Secondary | ICD-10-CM | POA: Diagnosis not present

## 2021-03-09 DIAGNOSIS — M25569 Pain in unspecified knee: Secondary | ICD-10-CM | POA: Insufficient documentation

## 2021-03-09 DIAGNOSIS — M79602 Pain in left arm: Secondary | ICD-10-CM

## 2021-03-09 DIAGNOSIS — M25561 Pain in right knee: Secondary | ICD-10-CM

## 2021-03-09 DIAGNOSIS — F4321 Adjustment disorder with depressed mood: Secondary | ICD-10-CM | POA: Diagnosis not present

## 2021-03-09 LAB — COMPREHENSIVE METABOLIC PANEL
ALT: 20 U/L (ref 0–35)
AST: 26 U/L (ref 0–37)
Albumin: 4.6 g/dL (ref 3.5–5.2)
Alkaline Phosphatase: 52 U/L (ref 39–117)
BUN: 20 mg/dL (ref 6–23)
CO2: 28 mEq/L (ref 19–32)
Calcium: 9.8 mg/dL (ref 8.4–10.5)
Chloride: 100 mEq/L (ref 96–112)
Creatinine, Ser: 0.77 mg/dL (ref 0.40–1.20)
GFR: 84.05 mL/min (ref >60.00)
Glucose, Bld: 87 mg/dL (ref 70–99)
Potassium: 4.5 mEq/L (ref 3.5–5.1)
Sodium: 137 mEq/L (ref 135–145)
Total Bilirubin: 0.6 mg/dL (ref 0.2–1.2)
Total Protein: 7.6 g/dL (ref 6.0–8.3)

## 2021-03-09 LAB — CBC
HCT: 39.4 % (ref 36.0–46.0)
Hemoglobin: 13.1 g/dL (ref 12.0–15.0)
MCHC: 33.2 g/dL (ref 30.0–36.0)
MCV: 84.7 fl (ref 78.0–100.0)
Platelets: 240 10*3/uL (ref 150.0–400.0)
RBC: 4.66 Mil/uL (ref 3.87–5.11)
RDW: 14.3 % (ref 11.5–15.5)
WBC: 5.4 10*3/uL (ref 4.0–10.5)

## 2021-03-09 LAB — LIPID PANEL
Cholesterol: 210 mg/dL — ABNORMAL HIGH (ref 0–200)
HDL: 60.8 mg/dL (ref >39.00)
LDL Cholesterol: 136 mg/dL — ABNORMAL HIGH (ref 0–99)
NonHDL: 149.13
Total CHOL/HDL Ratio: 3
Triglycerides: 65 mg/dL (ref 0.0–149.0)
VLDL: 13 mg/dL (ref 0.0–40.0)

## 2021-03-09 MED ORDER — CYCLOBENZAPRINE HCL 5 MG PO TABS: 5.0000 mg | ORAL_TABLET | Freq: Every evening | ORAL | 0 refills | Status: DC | PRN

## 2021-03-09 MED ORDER — FLUOXETINE HCL 20 MG PO CAPS: 20.0000 mg | ORAL_CAPSULE | Freq: Every day | ORAL | 3 refills | Status: DC

## 2021-03-09 NOTE — Assessment & Plan Note (Signed)
Improved since on fluoxetine 20 mg daily, continue same.

## 2021-03-09 NOTE — Patient Instructions (Signed)
Stop by the lab and xray prior to leaving today. I will notify you of your results once received.   You may take the cyclobenzaprine 5 mg tablets as needed at bedtime for your shoulder and arm.  You can try diclofenac gel to the knee for pain.   Consider seeing Dr. Lorelei Pont for an evaluation.   It was a pleasure to see you today!   Preventive Care 21-60 Years Old, Female Preventive care refers to lifestyle choices and visits with your health care provider that can promote health and wellness. This includes:  A yearly physical exam. This is also called an annual wellness visit.  Regular dental and eye exams.  Immunizations.  Screening for certain conditions.  Healthy lifestyle choices, such as: ? Eating a healthy diet. ? Getting regular exercise. ? Not using drugs or products that contain nicotine and tobacco. ? Limiting alcohol use. What can I expect for my preventive care visit? Physical exam Your health care provider will check your:  Height and weight. These may be used to calculate your BMI (body mass index). BMI is a measurement that tells if you are at a healthy weight.  Heart rate and blood pressure.  Body temperature.  Skin for abnormal spots. Counseling Your health care provider may ask you questions about your:  Past medical problems.  Family's medical history.  Alcohol, tobacco, and drug use.  Emotional well-being.  Home life and relationship well-being.  Sexual activity.  Diet, exercise, and sleep habits.  Work and work Statistician.  Access to firearms.  Method of birth control.  Menstrual cycle.  Pregnancy history. What immunizations do I need? Vaccines are usually given at various ages, according to a schedule. Your health care provider will recommend vaccines for you based on your age, medical history, and lifestyle or other factors, such as travel or where you work.   What tests do I need? Blood tests  Lipid and cholesterol levels.  These may be checked every 5 years, or more often if you are over 47 years old.  Hepatitis C test.  Hepatitis B test. Screening  Lung cancer screening. You may have this screening every year starting at age 52 if you have a 30-pack-year history of smoking and currently smoke or have quit within the past 15 years.  Colorectal cancer screening. ? All adults should have this screening starting at age 45 and continuing until age 50. ? Your health care provider may recommend screening at age 60 if you are at increased risk. ? You will have tests every 1-10 years, depending on your results and the type of screening test.  Diabetes screening. ? This is done by checking your blood sugar (glucose) after you have not eaten for a while (fasting). ? You may have this done every 1-3 years.  Mammogram. ? This may be done every 1-2 years. ? Talk with your health care provider about when you should start having regular mammograms. This may depend on whether you have a family history of breast cancer.  BRCA-related cancer screening. This may be done if you have a family history of breast, ovarian, tubal, or peritoneal cancers.  Pelvic exam and Pap test. ? This may be done every 3 years starting at age 28. ? Starting at age 74, this may be done every 5 years if you have a Pap test in combination with an HPV test. Other tests  STD (sexually transmitted disease) testing, if you are at risk.  Bone density scan. This is  done to screen for osteoporosis. You may have this scan if you are at high risk for osteoporosis. Talk with your health care provider about your test results, treatment options, and if necessary, the need for more tests. Follow these instructions at home: Eating and drinking  Eat a diet that includes fresh fruits and vegetables, whole grains, lean protein, and low-fat dairy products.  Take vitamin and mineral supplements as recommended by your health care provider.  Do not drink  alcohol if: ? Your health care provider tells you not to drink. ? You are pregnant, may be pregnant, or are planning to become pregnant.  If you drink alcohol: ? Limit how much you have to 0-1 drink a day. ? Be aware of how much alcohol is in your drink. In the [place] one drink equals one 12 oz bottle of beer (355 mL), one 5 oz glass of wine (148 mL), or one 1 oz glass of hard liquor (44 mL).   Lifestyle  Take daily care of your teeth and gums. Brush your teeth every morning and night with fluoride toothpaste. Floss one time each day.  Stay active. Exercise for at least 30 minutes 5 or more days each week.  Do not use any products that contain nicotine or tobacco, such as cigarettes, e-cigarettes, and chewing tobacco. If you need help quitting, ask your health care provider.  Do not use drugs.  If you are sexually active, practice safe sex. Use a condom or other form of protection to prevent STIs (sexually transmitted infections).  If you do not wish to become pregnant, use a form of birth control. If you plan to become pregnant, see your health care provider for a prepregnancy visit.  If told by your health care provider, take low-dose aspirin daily starting at age 92.  Find healthy ways to cope with stress, such as: ? Meditation, yoga, or listening to music. ? Journaling. ? Talking to a trusted person. ? Spending time with friends and family. Safety  Always wear your seat belt while driving or riding in a vehicle.  Do not drive: ? If you have been drinking alcohol. Do not ride with someone who has been drinking. ? When you are tired or distracted. ? While texting.  Wear a helmet and other protective equipment during sports activities.  If you have firearms in your house, make sure you follow all gun safety procedures. What's next?  Visit your health care provider once a year for an annual wellness visit.  Ask your health care provider how often you should have your eyes  and teeth checked.  Stay up to date on all vaccines. This information is not intended to replace advice given to you by your health care provider. Make sure you discuss any questions you have with your health care provider. Document Revised: 08/09/2020 Document Reviewed: 07/17/2018 Elsevier Patient Education  2021 [employer].

## 2021-03-09 NOTE — Progress Notes (Signed)
Subjective:    Patient ID: Alice Cherry, female    DOB: 1961/09/29, 60 y.o.   MRN: [id number]  HPI  Alice Cherry is a very pleasant 60 y.o. female who presents today for complete physical.  She would also like to discuss acute right knee pain. Occurs to the right medial knee with sensation of a "poping and clicking", intermittent. Pain is worse when laying onto her left side, also with some weight bearing movement. 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 She is walking 2 miles daily and now struggles.   Immunizations: -Tetanus: 2012 -Influenza: Completed this season  -Covid-19: Completed three vaccines -[facility]  Diet: She endorses a healthy diet.  Exercise:  She is swimming and walking  Eye exam: Completes annually  Dental exam: Completes semi-annually   Pap Smear: Hysterectomy  Mammogram: Completes annually.  Colonoscopy: 2018, due in 2021.    Wt Readings from Last 3 Encounters:  03/09/21 182 lb (82.6 kg)  02/02/21 185 lb (83.9 kg)  08/29/20 175 lb (79.4 kg)   BP Readings from Last 3 Encounters:  03/09/21 128/68  02/02/21 132/74  03/09/20 122/80      Review of Systems  Constitutional: Negative for unexpected weight change.  HENT: Negative for rhinorrhea.   Eyes: Negative for visual disturbance.  Respiratory: Negative for shortness of breath.   Cardiovascular: Negative for chest pain.  Gastrointestinal: Negative for constipation and diarrhea.  Genitourinary: Negative for difficulty urinating.  Musculoskeletal: Positive for arthralgias. Negative for myalgias.       Chronic left shoulder and upper extremity pain  Skin: Negative for rash.  Allergic/Immunologic: Negative for environmental allergies.  Neurological: Positive for numbness. Negative for dizziness and headaches.  Psychiatric/Behavioral: The patient is not nervous/anxious.          History reviewed. No pertinent past medical history.  Social History    Socioeconomic History  . Marital status: Married    Spouse name: Not on file  . Number of children: Not on file  . Years of education: Not on file  . Highest education level: Not on file  Occupational History  . Not on file  Tobacco Use  . Smoking status: Never Smoker  . Smokeless tobacco: Never Used  Substance and Sexual Activity  . Alcohol use: Not Currently  . Drug use: Not Currently  . Sexual activity: Not on file  Other Topics Concern  . Not on file  Social History Narrative  . Not on file   Social Determinants of Health   Financial Resource Strain: Not on file  Food Insecurity: Not on file  Transportation Needs: Not on file  Physical Activity: Not on file  Stress: Not on file  Social Connections: Not on file  Intimate Partner Violence: Not on file    Past Surgical History:  Procedure Laterality Date  . ABDOMINAL HYSTERECTOMY  1995  . APPENDECTOMY    . MASTECTOMY      Family History  Problem Relation Age of Onset  . Diabetes Mother   . Alzheimer's disease Mother   . Skin cancer Father   . Cancer Father        multiple myeloma    Allergies  Allergen Reactions  . Penicillins     Current Outpatient Medications on File Prior to Visit  Medication Sig Dispense Refill  . cyclobenzaprine (FLEXERIL) 5 MG tablet Take 1 tablet (5 mg total) by mouth at bedtime as needed for muscle spasms. 14 tablet  0  . FLUoxetine (PROZAC) 20 MG capsule Take 1 capsule (20 mg total) by mouth 2 (two) times daily. 60 capsule 2  . predniSONE (DELTASONE) 20 MG tablet Take 2 tablets by mouth once daily for three days, then 1 tablet once daily for three days. 9 tablet 0   No current facility-administered medications on file prior to visit.    BP 128/68   Pulse 62   Temp 98 F (36.7 C) (Temporal)   Ht 5' 7.75" (1.721 m)   Wt 182 lb (82.6 kg)   SpO2 96%   BMI 27.88 kg/m  Objective:   Physical Exam HENT:     Right Ear: Tympanic membrane and ear canal normal.     Left Ear:  Tympanic membrane and ear canal normal.     Nose: Nose normal.  Eyes:     Conjunctiva/sclera: Conjunctivae normal.     Pupils: Pupils are equal, round, and reactive to light.  Neck:     Thyroid: No thyromegaly.  Cardiovascular:     Rate and Rhythm: Normal rate and regular rhythm.     Heart sounds: No murmur heard.   Pulmonary:     Effort: Pulmonary effort is normal.     Breath sounds: Normal breath sounds. No rales.  Abdominal:     General: Bowel sounds are normal.     Palpations: Abdomen is soft.     Tenderness: There is no abdominal tenderness.  Musculoskeletal:        General: Normal range of motion.     Cervical back: Neck supple.  Lymphadenopathy:     Cervical: No cervical adenopathy.  Skin:    General: Skin is warm and dry.     Findings: No rash.  Neurological:     Mental Status: She is alert and oriented to person, place, and time.     Cranial Nerves: No cranial nerve deficit.     Deep Tendon Reflexes: Reflexes are normal and symmetric.  Psychiatric:        Mood and Affect: Mood normal.           Assessment & Plan:      This visit occurred during the SARS-CoV-2 public health emergency.  Safety protocols were in place, including screening questions prior to the visit, additional usage of staff PPE, and extensive cleaning of exam room while observing appropriate contact time as indicated for disinfecting solutions.

## 2021-03-09 NOTE — Assessment & Plan Note (Signed)
Acute for the last 6 weeks with weight bearing exercise and pressure mostly.  She is very motivated to lose weight, has a plan to lose 25 pounds, this will help.  Discussed bracing, walking, swimming. Offered PT for which she kindly declines. Discussed sports medicine visit if needed, she may benefit from a steroid injection.   Checking plain films today. May need MRI.

## 2021-03-09 NOTE — Assessment & Plan Note (Signed)
Chronic and continued, temporary relief with prednisone. Will provide her with PRN supply of Flexeril 5 mg.   She will update.

## 2021-03-09 NOTE — Assessment & Plan Note (Signed)
Tetanus due, provided today. [facility], will obtain dates.  Mammogram UTD. Colonoscopy appears to be due, she will find out from her oncologist.   Discussed the importance of a healthy diet and regular exercise in order for weight loss, and to reduce the risk of any potential medical problems.  Exam today as noted. Labs pending.

## 2021-03-09 NOTE — Assessment & Plan Note (Signed)
Doing well on fluoxetine 20 mg daily, continue same. Refills provided.

## 2021-03-20 DIAGNOSIS — C50912 Malignant neoplasm of unspecified site of left female breast: Secondary | ICD-10-CM | POA: Diagnosis not present

## 2021-03-20 DIAGNOSIS — Z79899 Other long term (current) drug therapy: Secondary | ICD-10-CM | POA: Diagnosis not present

## 2021-03-20 DIAGNOSIS — Z170 Estrogen receptor positive status [ER+]: Secondary | ICD-10-CM | POA: Diagnosis not present

## 2021-03-20 DIAGNOSIS — C50919 Malignant neoplasm of unspecified site of unspecified female breast: Secondary | ICD-10-CM | POA: Diagnosis not present

## 2021-03-20 DIAGNOSIS — M8589 Other specified disorders of bone density and structure, multiple sites: Secondary | ICD-10-CM | POA: Diagnosis not present

## 2021-03-27 ENCOUNTER — Encounter: Payer: Self-pay | Admitting: Family Medicine

## 2021-03-27 ENCOUNTER — Other Ambulatory Visit: Payer: Self-pay

## 2021-03-27 ENCOUNTER — Ambulatory Visit (INDEPENDENT_AMBULATORY_CARE_PROVIDER_SITE_OTHER): Payer: BC Managed Care – PPO | Admitting: Family Medicine

## 2021-03-27 VITALS — BP 118/74 | HR 78 | Temp 98.3°F | Ht 67.75 in | Wt 182.0 lb

## 2021-03-27 DIAGNOSIS — M25561 Pain in right knee: Secondary | ICD-10-CM

## 2021-03-27 DIAGNOSIS — M2391 Unspecified internal derangement of right knee: Secondary | ICD-10-CM

## 2021-03-27 DIAGNOSIS — R29898 Other symptoms and signs involving the musculoskeletal system: Secondary | ICD-10-CM

## 2021-03-27 DIAGNOSIS — M7061 Trochanteric bursitis, right hip: Secondary | ICD-10-CM | POA: Diagnosis not present

## 2021-03-27 MED ORDER — TRIAMCINOLONE ACETONIDE 40 MG/ML IJ SUSP
40.0000 mg | Freq: Once | INTRAMUSCULAR | Status: AC
  Administered 2021-03-27: 40 mg via INTRA_ARTICULAR

## 2021-03-27 NOTE — Progress Notes (Signed)
Ricci Paff T. Jerik Falletta, MD, [place]  [facility] and [place] at [facility] [place], [address]  Phone: [phone]  FAX: [phone]  Alice Cherry - 60 y.o. female  MRN [id number]  Date of Birth: 17-Dec-1960  Date: 03/27/2021  PCP: Pleas Koch, NP  Referral: Pleas Koch, NP  Chief Complaint  Patient presents with  . Knee Pain    Right  . Hip Pain    Right    This visit occurred during the SARS-CoV-2 public health emergency.  Safety protocols were in place, including screening questions prior to the visit, additional usage of staff PPE, and extensive cleaning of exam room while observing appropriate contact time as indicated for disinfecting solutions.   Subjective:   Alice Cherry is a 60 y.o. very pleasant female patient with Body mass index is 27.88 kg/m. who presents with the following:  Very nice young lady.  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  She presents with a 4-week history of right-sided medial knee pain.  She does not have any specific injury that she can recall.  Her pain is daily, and she has limited now much in terms of some of her activities of daily living including getting in and out of a car, going up and down stairs.  She has pain even when sitting.  Going downstairs is better compared to going upstairs.  She is not having any significant effusion.  She does not describe any symptomatic giving way. She does have a mechanical click on the right side with moving her knee.  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  She has tried multiple modalities thus far including daily icing, Tylenol, heat, NSAIDs and topical Voltaren.  None of these seem to make much of a difference.  She has no significant prior knee injuries.  No prior history of fracture or operative  intervention in the right knee.  She also has developed some right-sided lateral hip pain.  Review of Systems is noted in the HPI, as appropriate   Objective:   BP 118/74   Pulse 78   Temp 98.3 F (36.8 C) (Temporal)   Ht 5' 7.75" (1.721 m)   Wt 182 lb (82.6 kg)   SpO2 95%   BMI 27.88 kg/m   GEN: No acute distress; alert,appropriate. PULM: Breathing comfortably in no respiratory distress PSYCH: Normally interactive.   She does have some notable pain with ambulation and getting up and down for the examination table.  Right knee: No effusion.  No significant pain with loading the patellar facets.  Apprehension test is negative.  Nontender along the patellar tendon or quadricep tendon. Not tender at the Pes anserine bursa or the tibial tubercle or the tibial plateau.  Nontender throughout the fibula.  She does have full extension of the knee and flexion to 115 before pain.  Stable to varus and valgus stress.  Lachman is negative.  Anterior and posterior drawer testing is negative.  She does have medial joint line tenderness, worse in the posterior medial aspect of the joint line.  Flexion pinch and any form of deep flexion causes pain.  McMurray's is positive with both pain and a mechanical click.  Excellent global range of motion at the hip in all directions.  She does have some lateral trochanteric bursa pain.  Strength testing:  Extension and flexion at the knee is 5/5 Hip flexion 2+/5 Hip adduction: 3/5 Hip abduction 4-/5  Radiology: DG Knee AP/LAT W/Sunrise Right  Result Date: 03/10/2021 CLINICAL DATA:  Acute knee pain. EXAM: RIGHT KNEE 3 VIEWS COMPARISON:  None. FINDINGS: No fracture. No subluxation or dislocation. No joint effusion. Mild loss of joint space noted medial compartment. Tiny radiopaque foreign body identified in the medial soft tissues of the anterior knee. IMPRESSION: 1. No acute bony abnormality. 2. Tiny radiopaque foreign body in the medial soft tissues  of the anterior knee. Electronically Signed   By: Misty Stanley M.D.   On: 03/10/2021 10:56    Assessment and Plan:     ICD-10-CM   1. Internal derangement of right knee  M23.91 Ambulatory referral to Physical Therapy    triamcinolone acetonide (KENALOG-40) injection 40 mg  2. Acute pain of right knee  M25.561 Ambulatory referral to Physical Therapy  3. Trochanteric bursitis, right hip  M70.61   4. Weakness of right hip  R29.898    1 month history of nontraumatic knee pain with medial joint line tenderness.  Pain with deep flexion and mechanical click on McMurray's.  On my radiographical read, I do think on the right side the patient does have minimal to mild medial compartmental osteoarthritis.  Etiology most consistent with meniscal pathology. At this point she is extremely weak, and without strengthening her leg and hip musculature, likelihood of conservative recovery is much lower.  I gave the patient a home rehab program, but I am also going to have her do some formal physical therapy. She will follow-up with me in 6 weeks in this regard.  We verbally reviewed treatment algorithms for knee pain in this situation.  Intra-articular knee injection today.  She also has some secondary trochanteric bursitis on the right.  I think that the primary issue is addressing the knee.  Aspiration/Injection Procedure Note Alice Cherry 1961/06/26 Date of procedure: 03/27/2021  Procedure: Large Joint Aspiration / Injection of Knee, R Indications: Pain  Procedure Details Patient verbally consented to procedure. Risks, benefits, and alternatives explained. Sterilely prepped with Chloraprep. Ethyl cholride used for anesthesia. 9 cc Lidocaine 1% mixed with 1 mL of Kenalog 40 mg injected using the anteromedial approach without difficulty. No complications with procedure and tolerated well. Patient had decreased pain post-injection. Medication: 1 mL of Kenalog 40 mg  Patient Instructions   Supplements: Tart cherry juice have good scientific evidence  - get the concentrated capsules or gelcaps over the counter so you do not get the calories from the juice.  Alleve 2 tabs by mouth two times a day over the counter: Take at least for 2 - 3 weeks. This is equal to a prescripton strength dose (GENERIC CHEAPER EQUIVALENT IS NAPROXEN SODIUM)      Meds ordered this encounter  Medications  . triamcinolone acetonide (KENALOG-40) injection 40 mg   Orders Placed This Encounter  Procedures  . Ambulatory referral to Physical Therapy    Follow-up: Return in about 5 weeks (around 05/01/2021) for follow-up right knee.  Signed,  Maud Deed. Alice Kotula, MD   Outpatient Encounter Medications as of 03/27/2021  Medication Sig  . cyclobenzaprine (FLEXERIL) 5 MG tablet Take 1 tablet (5 mg total) by mouth at bedtime as needed for muscle spasms.  Marland Kitchen FLUoxetine (PROZAC) 20 MG capsule Take 1 capsule (20 mg total) by mouth daily. For depression  . [EXPIRED] triamcinolone acetonide (KENALOG-40) injection 40 mg    No facility-administered encounter medications on file  as of 03/27/2021.

## 2021-03-27 NOTE — Patient Instructions (Signed)
Supplements: Tart cherry juice have good scientific evidence  - get the concentrated capsules or gelcaps over the counter so you do not get the calories from the juice.  Alleve 2 tabs by mouth two times a day over the counter: Take at least for 2 - 3 weeks. This is equal to a prescripton strength dose (GENERIC CHEAPER EQUIVALENT IS NAPROXEN SODIUM)

## 2021-05-02 ENCOUNTER — Ambulatory Visit: Payer: BC Managed Care – PPO | Admitting: Primary Care

## 2021-05-02 DIAGNOSIS — Z85828 Personal history of other malignant neoplasm of skin: Secondary | ICD-10-CM | POA: Diagnosis not present

## 2021-05-02 DIAGNOSIS — D225 Melanocytic nevi of trunk: Secondary | ICD-10-CM | POA: Diagnosis not present

## 2021-05-02 DIAGNOSIS — X32XXXA Exposure to sunlight, initial encounter: Secondary | ICD-10-CM | POA: Diagnosis not present

## 2021-05-02 DIAGNOSIS — D485 Neoplasm of uncertain behavior of skin: Secondary | ICD-10-CM | POA: Diagnosis not present

## 2021-05-02 DIAGNOSIS — D2261 Melanocytic nevi of right upper limb, including shoulder: Secondary | ICD-10-CM | POA: Diagnosis not present

## 2021-05-02 DIAGNOSIS — L570 Actinic keratosis: Secondary | ICD-10-CM | POA: Diagnosis not present

## 2021-05-02 DIAGNOSIS — D2262 Melanocytic nevi of left upper limb, including shoulder: Secondary | ICD-10-CM | POA: Diagnosis not present

## 2021-06-09 ENCOUNTER — Other Ambulatory Visit: Payer: Self-pay | Admitting: Primary Care

## 2021-06-09 DIAGNOSIS — M79602 Pain in left arm: Secondary | ICD-10-CM

## 2021-06-09 NOTE — Telephone Encounter (Signed)
Does she actually need a refill of her cyclobenzaprine muscle relaxer?  This may be an auto refill as I believe she uses it just as needed.

## 2021-06-15 NOTE — Telephone Encounter (Signed)
Left message for patient to call back  

## 2021-06-19 NOTE — Telephone Encounter (Signed)
Left message to return call to our office.  

## 2021-06-26 NOTE — Telephone Encounter (Signed)
Refill not needed at this time per patient.

## 2021-07-20 ENCOUNTER — Telehealth (INDEPENDENT_AMBULATORY_CARE_PROVIDER_SITE_OTHER): Payer: BC Managed Care – PPO | Admitting: Primary Care

## 2021-07-20 ENCOUNTER — Other Ambulatory Visit: Payer: Self-pay

## 2021-07-20 VITALS — Ht 67.5 in | Wt 182.0 lb

## 2021-07-20 DIAGNOSIS — H0289 Other specified disorders of eyelid: Secondary | ICD-10-CM

## 2021-07-20 DIAGNOSIS — H02849 Edema of unspecified eye, unspecified eyelid: Secondary | ICD-10-CM

## 2021-07-20 HISTORY — DX: Other specified disorders of eyelid: H02.89

## 2021-07-20 HISTORY — DX: Edema of unspecified eye, unspecified eyelid: H02.849

## 2021-07-20 MED ORDER — ERYTHROMYCIN 5 MG/GM OP OINT: 1.0000 "application " | TOPICAL_OINTMENT | Freq: Every day | OPHTHALMIC | 0 refills | Status: DC

## 2021-07-20 NOTE — Progress Notes (Signed)
Patient ID: Alice Cherry, female    DOB: 02-03-1961, 60 y.o.   MRN: [id number]  Virtual visit completed through [place], a video enabled telemedicine application. Due to national recommendations of social distancing due to COVID-19, a virtual visit is felt to be most appropriate for this patient at this time. Reviewed limitations, risks, security and privacy concerns of performing a virtual visit and the availability of in person appointments. I also reviewed that there may be a patient responsible charge related to this service. The patient agreed to proceed.   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, office Persons participating in this virtual visit: patient, provider   If any vitals were documented, they were collected by patient at home unless specified below.    Ht 5' 7.5" (1.715 m)   Wt 182 lb (82.6 kg)   BMI 28.08 kg/m    CC: Eye swelling Subjective:   HPI: Alice Cherry is a 60 y.o. female presenting on 07/20/2021 for eye lid swelling.  She's noticed swelling to the upper lid of her left eye for the last 24 hours. The lid is warm and tender to touch. She denies injection to the conjunctiva, color changes to the sclera, drainage, right eye involvement, visual changes.   No recent changes in anything her normal daily routine.       Relevant past medical, surgical, family and social history reviewed and updated as indicated. Interim medical history since our last visit reviewed. Allergies and medications reviewed and updated. Outpatient Medications Prior to Visit  Medication Sig Dispense Refill   cyclobenzaprine (FLEXERIL) 5 MG tablet Take 1 tablet (5 mg total) by mouth at bedtime as needed for muscle spasms. 90 tablet 0   FLUoxetine (PROZAC) 20 MG capsule Take 1 capsule (20 mg total) by mouth daily. For depression 90 capsule 3   No facility-administered medications prior to visit.     Per HPI unless specifically indicated in ROS section  below Review of Systems  Constitutional:  Negative for fever.  Eyes:  Positive for pain. Negative for discharge, redness, itching and visual disturbance.       Left upper eye lid swelling  Objective:  Ht 5' 7.5" (1.715 m)   Wt 182 lb (82.6 kg)   BMI 28.08 kg/m   Wt Readings from Last 3 Encounters:  07/20/21 182 lb (82.6 kg)  03/27/21 182 lb (82.6 kg)  03/09/21 182 lb (82.6 kg)       Physical exam: Gen: alert, NAD, not ill appearing Pulm: speaks in complete sentences without increased work of breathing Psych: normal mood, normal thought content  Eyes: mild to moderate swelling with erythema to left upper eye lid. Sclera white, no obvious drainage/discharge. Right eye unremarkable.      Results for orders placed or performed in visit on 03/09/21  Comprehensive metabolic panel  Result Value Ref Range   Sodium 137 135 - 145 mEq/L   Potassium 4.5 3.5 - 5.1 mEq/L   Chloride 100 96 - 112 mEq/L   CO2 28 19 - 32 mEq/L   Glucose, Bld 87 70 - 99 mg/dL   BUN 20 6 - 23 mg/dL   Creatinine, Ser 0.77 0.40 - 1.20 mg/dL   Total Bilirubin 0.6 0.2 - 1.2 mg/dL   Alkaline Phosphatase 52 39 - 117 U/L   AST 26 0 - 37 U/L   ALT 20 0 - 35 U/L   Total Protein 7.6 6.0 - 8.3 g/dL   Albumin 4.6 3.5 -  5.2 g/dL   GFR 84.05 >60.00 mL/min   Calcium 9.8 8.4 - 10.5 mg/dL  CBC  Result Value Ref Range   WBC 5.4 4.0 - 10.5 K/uL   RBC 4.66 3.87 - 5.11 Mil/uL   Platelets 240.0 150.0 - 400.0 K/uL   Hemoglobin 13.1 12.0 - 15.0 g/dL   HCT 39.4 36.0 - 46.0 %   MCV 84.7 78.0 - 100.0 fl   MCHC 33.2 30.0 - 36.0 g/dL   RDW 14.3 11.5 - 15.5 %  Lipid panel  Result Value Ref Range   Cholesterol 210 (H) 0 - 200 mg/dL   Triglycerides 65.0 0.0 - 149.0 mg/dL   HDL 60.80 >39.00 mg/dL   VLDL 13.0 0.0 - 40.0 mg/dL   LDL Cholesterol 136 (H) 0 - 99 mg/dL   Total CHOL/HDL Ratio 3    NonHDL 149.13    Assessment & Plan:   Problem List Items Addressed This Visit       Other   Pain and swelling of eyelid - Primary     Acute to left upper eyelid, appears suspicious for infection. No visual involvement. Rx for erythromycin ointment sent to pharmacy. Discussed instructions for use.   Update as needed.      Relevant Medications   erythromycin ophthalmic ointment     Meds ordered this encounter  Medications   erythromycin ophthalmic ointment    Sig: Place 1 application into the left eye at bedtime.    Dispense:  3.5 g    Refill:  0    Order Specific Question:   Supervising Provider    Answer:   BEDSOLE, AMY E [[id number]]   No orders of the defined types were placed in this encounter.   I discussed the assessment and treatment plan with the patient. The patient was provided an opportunity to ask questions and all were answered. The patient agreed with the plan and demonstrated an understanding of the instructions. The patient was advised to call back or seek an in-person evaluation if the symptoms worsen or if the condition fails to improve as anticipated.  Follow up plan:  Apply the erythromycin ointment to the left upper eye lid at bedtime for 5-7 days.   Please update me if no improvement.  It was a pleasure to see you today!   Pleas Koch, NP

## 2021-07-20 NOTE — Assessment & Plan Note (Signed)
Acute to left upper eyelid, appears suspicious for infection. No visual involvement. Rx for erythromycin ointment sent to pharmacy. Discussed instructions for use.   Update as needed.

## 2021-07-31 DIAGNOSIS — M1711 Unilateral primary osteoarthritis, right knee: Secondary | ICD-10-CM | POA: Diagnosis not present

## 2021-08-04 DIAGNOSIS — Z1231 Encounter for screening mammogram for malignant neoplasm of breast: Secondary | ICD-10-CM | POA: Diagnosis not present

## 2021-08-04 DIAGNOSIS — M8589 Other specified disorders of bone density and structure, multiple sites: Secondary | ICD-10-CM | POA: Diagnosis not present

## 2021-08-04 DIAGNOSIS — M8588 Other specified disorders of bone density and structure, other site: Secondary | ICD-10-CM | POA: Diagnosis not present

## 2021-08-04 LAB — HM DEXA SCAN

## 2021-08-04 LAB — HM MAMMOGRAPHY

## 2021-09-15 ENCOUNTER — Telehealth (INDEPENDENT_AMBULATORY_CARE_PROVIDER_SITE_OTHER): Payer: BC Managed Care – PPO | Admitting: Primary Care

## 2021-09-15 ENCOUNTER — Other Ambulatory Visit: Payer: Self-pay

## 2021-09-15 ENCOUNTER — Encounter: Payer: Self-pay | Admitting: Primary Care

## 2021-09-15 ENCOUNTER — Ambulatory Visit
Admission: RE | Admit: 2021-09-15 | Discharge: 2021-09-15 | Disposition: A | Discharge status: Home / Self Care | Payer: BC Managed Care – PPO | Source: Ambulatory Visit | Attending: Primary Care | Admitting: Primary Care

## 2021-09-15 VITALS — Ht 67.5 in | Wt 172.0 lb

## 2021-09-15 DIAGNOSIS — R053 Chronic cough: Secondary | ICD-10-CM | POA: Diagnosis not present

## 2021-09-15 NOTE — Patient Instructions (Signed)
Go to [facility] to complete your chest xray.  Go to [facility] for your labs.  Start an antihistamine such as Claritin/Allegra/Zyrtec. Also start famotidine (Pepcid) once daily.  I will be in touch again soon! Allie Bossier, NP-C

## 2021-09-15 NOTE — Progress Notes (Signed)
Patient ID: Alice Cherry, female    DOB: Jul 04, 1961, 60 y.o.   MRN: [id number]  Virtual visit completed through [place], a video enabled telemedicine application. Due to national recommendations of social distancing due to COVID-19, a virtual visit is felt to be most appropriate for this patient at this time. Reviewed limitations, risks, security and privacy concerns of performing a virtual visit and the availability of in person appointments. I also reviewed that there may be a patient responsible charge related to this service. The patient agreed to proceed.   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, office Persons participating in this virtual visit: patient, provider   If any vitals were documented, they were collected by patient at home unless specified below.    Ht 5' 7.5" (1.715 m)   Wt 172 lb (78 kg)   BMI 26.54 kg/m    CC: Persistent Cough Subjective:   HPI: Alice Cherry is a 60 y.o. female presenting on 09/15/2021 to discus cough and several other symptoms.   Her cough began 6 months ago after she contracted Covid-19 infection, however, has progressed over the last 2 months. Her cough is dry, feels like she has a lump in her throat. Will experience coughing spells at times. She initially suspected allergies as her cough is dry.   She denies fevers, chest tightness, prior history of asthma, history of tobacco abuse, hemoptysis, unexplained weight loss, esophageal burning, belching. She does have a history of breast cancer, diagnosed in 2017.  She's also struggled with fatigue for years, noticed her fatigue worsening about two months ago. She took a Covid-19 test yesterday which was negative. She did have Covid-19 about 6 months ago, uneventful illness, hardly felt sick.  She's tried Dayquil, sipping on warm soups without improvement.        Relevant past medical, surgical, family and social history reviewed and updated as indicated. Interim  medical history since our last visit reviewed. Allergies and medications reviewed and updated. Outpatient Medications Prior to Visit  Medication Sig Dispense Refill   FLUoxetine (PROZAC) 20 MG capsule Take 1 capsule (20 mg total) by mouth daily. For depression 90 capsule 3   meloxicam (MOBIC) 15 MG tablet Take 15 mg by mouth daily. PRN for Knee pain     cyclobenzaprine (FLEXERIL) 5 MG tablet Take 1 tablet (5 mg total) by mouth at bedtime as needed for muscle spasms. 90 tablet 0   erythromycin ophthalmic ointment Place 1 application into the left eye at bedtime. 3.5 g 0   No facility-administered medications prior to visit.     Per HPI unless specifically indicated in ROS section below Review of Systems  Constitutional:  Positive for fatigue. Negative for chills, fever and unexpected weight change.  HENT:  Positive for sore throat. Negative for congestion and postnasal drip.   Respiratory:  Negative for shortness of breath.   Cardiovascular:  Negative for chest pain.  Gastrointestinal:  Negative for nausea.  Neurological:  Negative for headaches.  Objective:  Ht 5' 7.5" (1.715 m)   Wt 172 lb (78 kg)   BMI 26.54 kg/m   Wt Readings from Last 3 Encounters:  09/15/21 172 lb (78 kg)  07/20/21 182 lb (82.6 kg)  03/27/21 182 lb (82.6 kg)       Physical exam: General: Alert and oriented x 3, no distress, does not appear sickly  Pulmonary: Speaks in complete sentences without increased work of breathing, deep/dry cough noted several times throughout visit.  Psychiatric:  Normal mood, thought content, and behavior.     Results for orders placed or performed in visit on 09/15/21  HM MAMMOGRAPHY  Result Value Ref Range   HM Mammogram 0-4 Bi-Rad 0-4 Bi-Rad, Self Reported Normal  HM DEXA SCAN  Result Value Ref Range   HM Dexa Scan In chart   HM COLONOSCOPY  Result Value Ref Range   HM Colonoscopy See Report (in chart) See Report (in chart), Patient Reported   Assessment & Plan:    Problem List Items Addressed This Visit       Other   Persistent cough for 3 weeks or longer - Primary    Appears to be since Covid-19 infection, now worse.  Unclear cause. Could be GERD or allergy induced.  Will check chest xray given history of breast cancer and duration of cough.  Checking labs.   Consider ICS inhaler.  Start antihistamine and H2 Blocker.  Await results.       Relevant Orders   DG Chest 2 View   CBC with Differential/Platelet   Basic metabolic panel     No orders of the defined types were placed in this encounter.  Orders Placed This Encounter  Procedures   HM MAMMOGRAPHY    This external order was created through the Results Console.   HM DEXA SCAN    This external order was created through the Results Console.   DG Chest 38 View    60 year old female with persistent cough for 6 months, worse over last 2 months. History of breast cancer. Non smoker.    Standing Status:   Future    Standing Expiration Date:   09/15/2022    Order Specific Question:   Reason for Exam (SYMPTOM  OR DIAGNOSIS REQUIRED)    Answer:   persistent cough    Order Specific Question:   Preferred imaging location?    Answer:   [facility]    Order Specific Question:   Radiology Contrast Protocol - do NOT remove file path    Answer:   [email or web address]   CBC with Differential/Platelet    Standing Status:   Future    Standing Expiration Date:   00/92/3300   Basic metabolic panel    Standing Status:   Future    Standing Expiration Date:   09/15/2022   HM COLONOSCOPY    This external order was created through the Results Console.    I discussed the assessment and treatment plan with the patient. The patient was provided an opportunity to ask questions and all were answered. The patient agreed with the plan and demonstrated an understanding of the instructions. The patient was advised to call back or seek an in-person  evaluation if the symptoms worsen or if the condition fails to improve as anticipated.  Follow up plan:  Go to [facility] to complete your chest xray.  Go to [facility] for your labs.  Start an antihistamine such as Claritin/Allegra/Zyrtec. Also start famotidine (Pepcid) once daily.  I will be in touch again soon! Allie Bossier, NP-C   Pleas Koch, NP

## 2021-09-15 NOTE — Assessment & Plan Note (Signed)
Appears to be since Covid-19 infection, now worse.  Unclear cause. Could be GERD or allergy induced.  Will check chest xray given history of breast cancer and duration of cough.  Checking labs.   Consider ICS inhaler.  Start antihistamine and H2 Blocker.  Await results.

## 2021-09-18 ENCOUNTER — Other Ambulatory Visit (INDEPENDENT_AMBULATORY_CARE_PROVIDER_SITE_OTHER): Payer: BC Managed Care – PPO

## 2021-09-18 ENCOUNTER — Other Ambulatory Visit: Payer: Self-pay

## 2021-09-18 ENCOUNTER — Ambulatory Visit
Admission: RE | Admit: 2021-09-18 | Discharge: 2021-09-18 | Disposition: A | Discharge status: Home / Self Care | Payer: BC Managed Care – PPO | Source: Ambulatory Visit | Attending: Primary Care | Admitting: Primary Care

## 2021-09-18 ENCOUNTER — Ambulatory Visit
Admission: RE | Admit: 2021-09-18 | Discharge: 2021-09-18 | Disposition: A | Discharge status: Home / Self Care | Payer: BC Managed Care – PPO | Attending: Primary Care | Admitting: Primary Care

## 2021-09-18 DIAGNOSIS — R059 Cough, unspecified: Secondary | ICD-10-CM | POA: Diagnosis not present

## 2021-09-18 DIAGNOSIS — R053 Chronic cough: Secondary | ICD-10-CM | POA: Insufficient documentation

## 2021-09-18 LAB — BASIC METABOLIC PANEL
BUN: 21 mg/dL (ref 6–23)
CO2: 28 mEq/L (ref 19–32)
Calcium: 9.3 mg/dL (ref 8.4–10.5)
Chloride: 101 mEq/L (ref 96–112)
Creatinine, Ser: 0.76 mg/dL (ref 0.40–1.20)
GFR: 85.06 mL/min (ref >60.00)
Glucose, Bld: 93 mg/dL (ref 70–99)
Potassium: 4.1 mEq/L (ref 3.5–5.1)
Sodium: 137 mEq/L (ref 135–145)

## 2021-09-18 LAB — CBC WITH DIFFERENTIAL/PLATELET
Basophils Absolute: 0 10*3/uL (ref 0.0–0.1)
Basophils Relative: 0.4 % (ref 0.0–3.0)
Eosinophils Absolute: 0.1 10*3/uL (ref 0.0–0.7)
Eosinophils Relative: 1.5 % (ref 0.0–5.0)
HCT: 37 % (ref 36.0–46.0)
Hemoglobin: 12.1 g/dL (ref 12.0–15.0)
Lymphocytes Relative: 26.7 % (ref 12.0–46.0)
Lymphs Abs: 1.5 10*3/uL (ref 0.7–4.0)
MCHC: 32.6 g/dL (ref 30.0–36.0)
MCV: 85.1 fl (ref 78.0–100.0)
Monocytes Absolute: 0.4 10*3/uL (ref 0.1–1.0)
Monocytes Relative: 7.3 % (ref 3.0–12.0)
Neutro Abs: 3.7 10*3/uL (ref 1.4–7.7)
Neutrophils Relative %: 64.1 % (ref 43.0–77.0)
Platelets: 238 10*3/uL (ref 150.0–400.0)
RBC: 4.35 Mil/uL (ref 3.87–5.11)
RDW: 14.3 % (ref 11.5–15.5)
WBC: 5.8 10*3/uL (ref 4.0–10.5)

## 2021-09-19 ENCOUNTER — Other Ambulatory Visit: Payer: Self-pay | Admitting: Primary Care

## 2021-09-19 DIAGNOSIS — R053 Chronic cough: Secondary | ICD-10-CM

## 2021-09-19 MED ORDER — QVAR REDIHALER 80 MCG/ACT IN AERB: 1.0000 | INHALATION_SPRAY | Freq: Two times a day (BID) | RESPIRATORY_TRACT | 0 refills | Status: DC

## 2021-09-27 IMAGING — DX DG KNEE AP/LAT W/ SUNRISE*R*
3 series · 3 of 3 positions shown · non-contrast
Comparison: None.

CLINICAL DATA: Acute knee pain.

EXAM:
RIGHT KNEE 3 VIEWS

[knee ap]
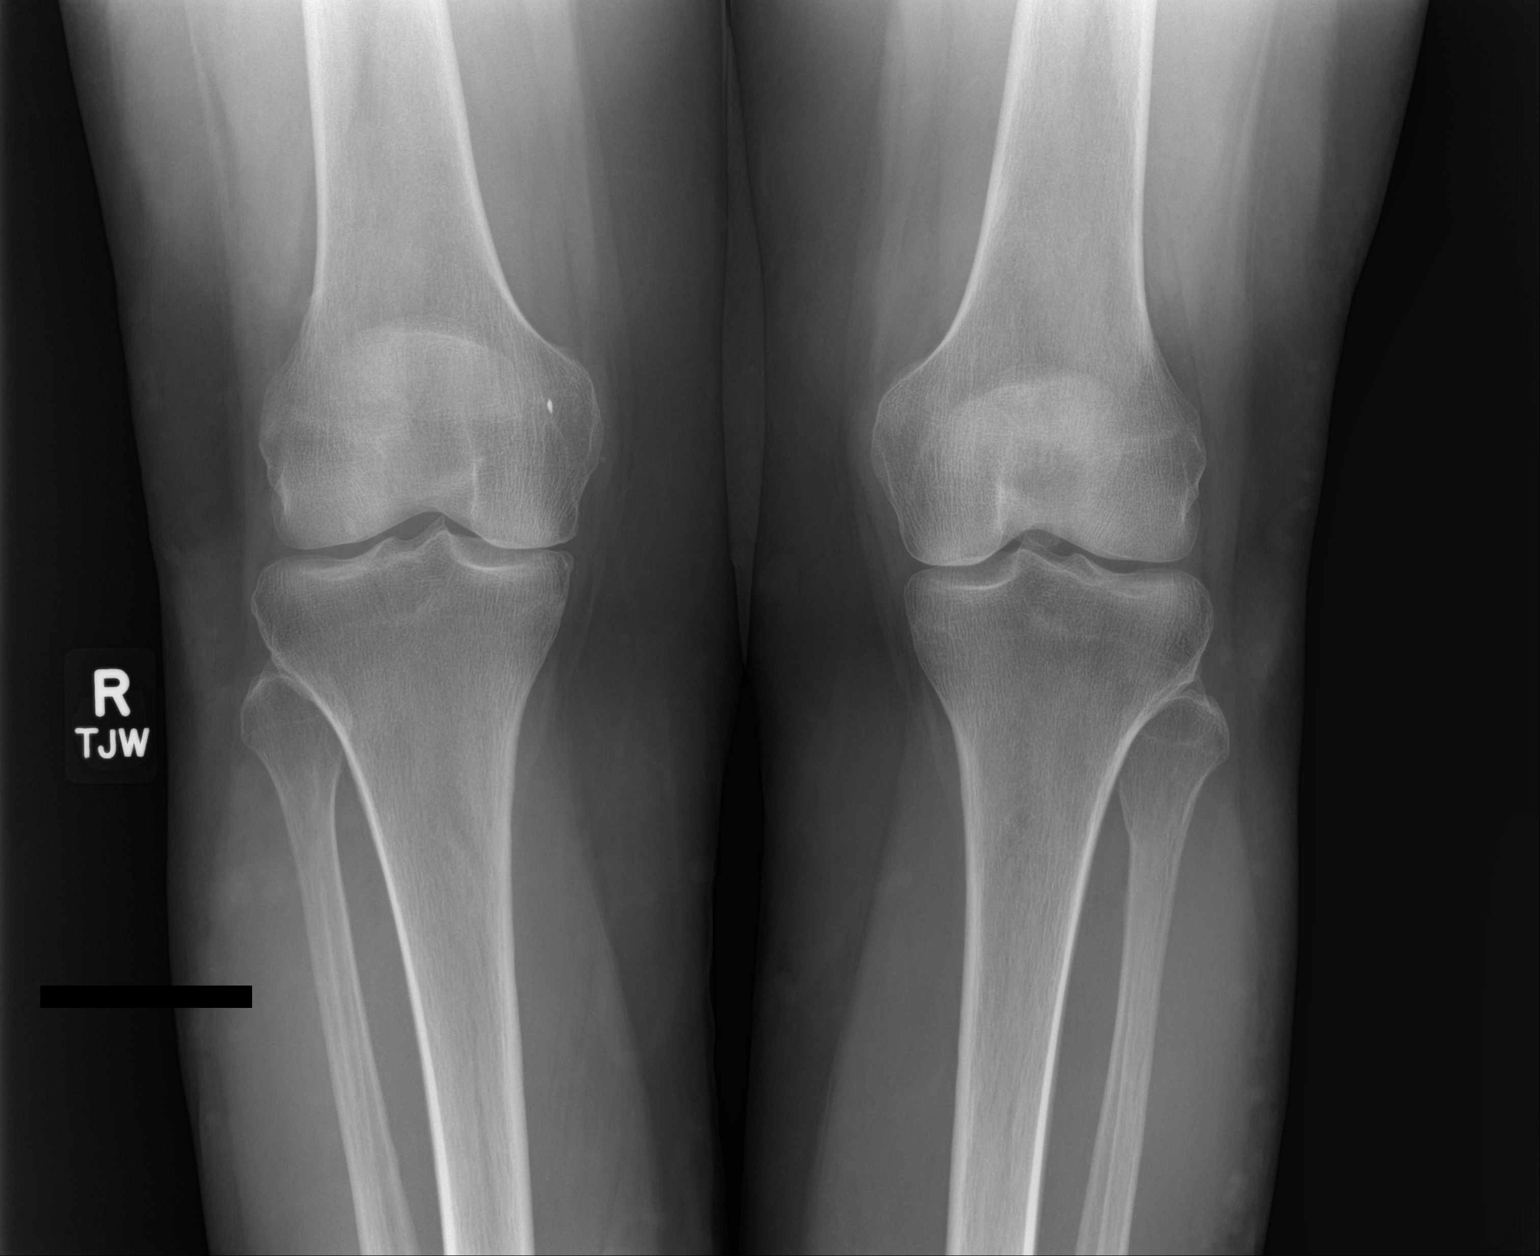

[knee lat]
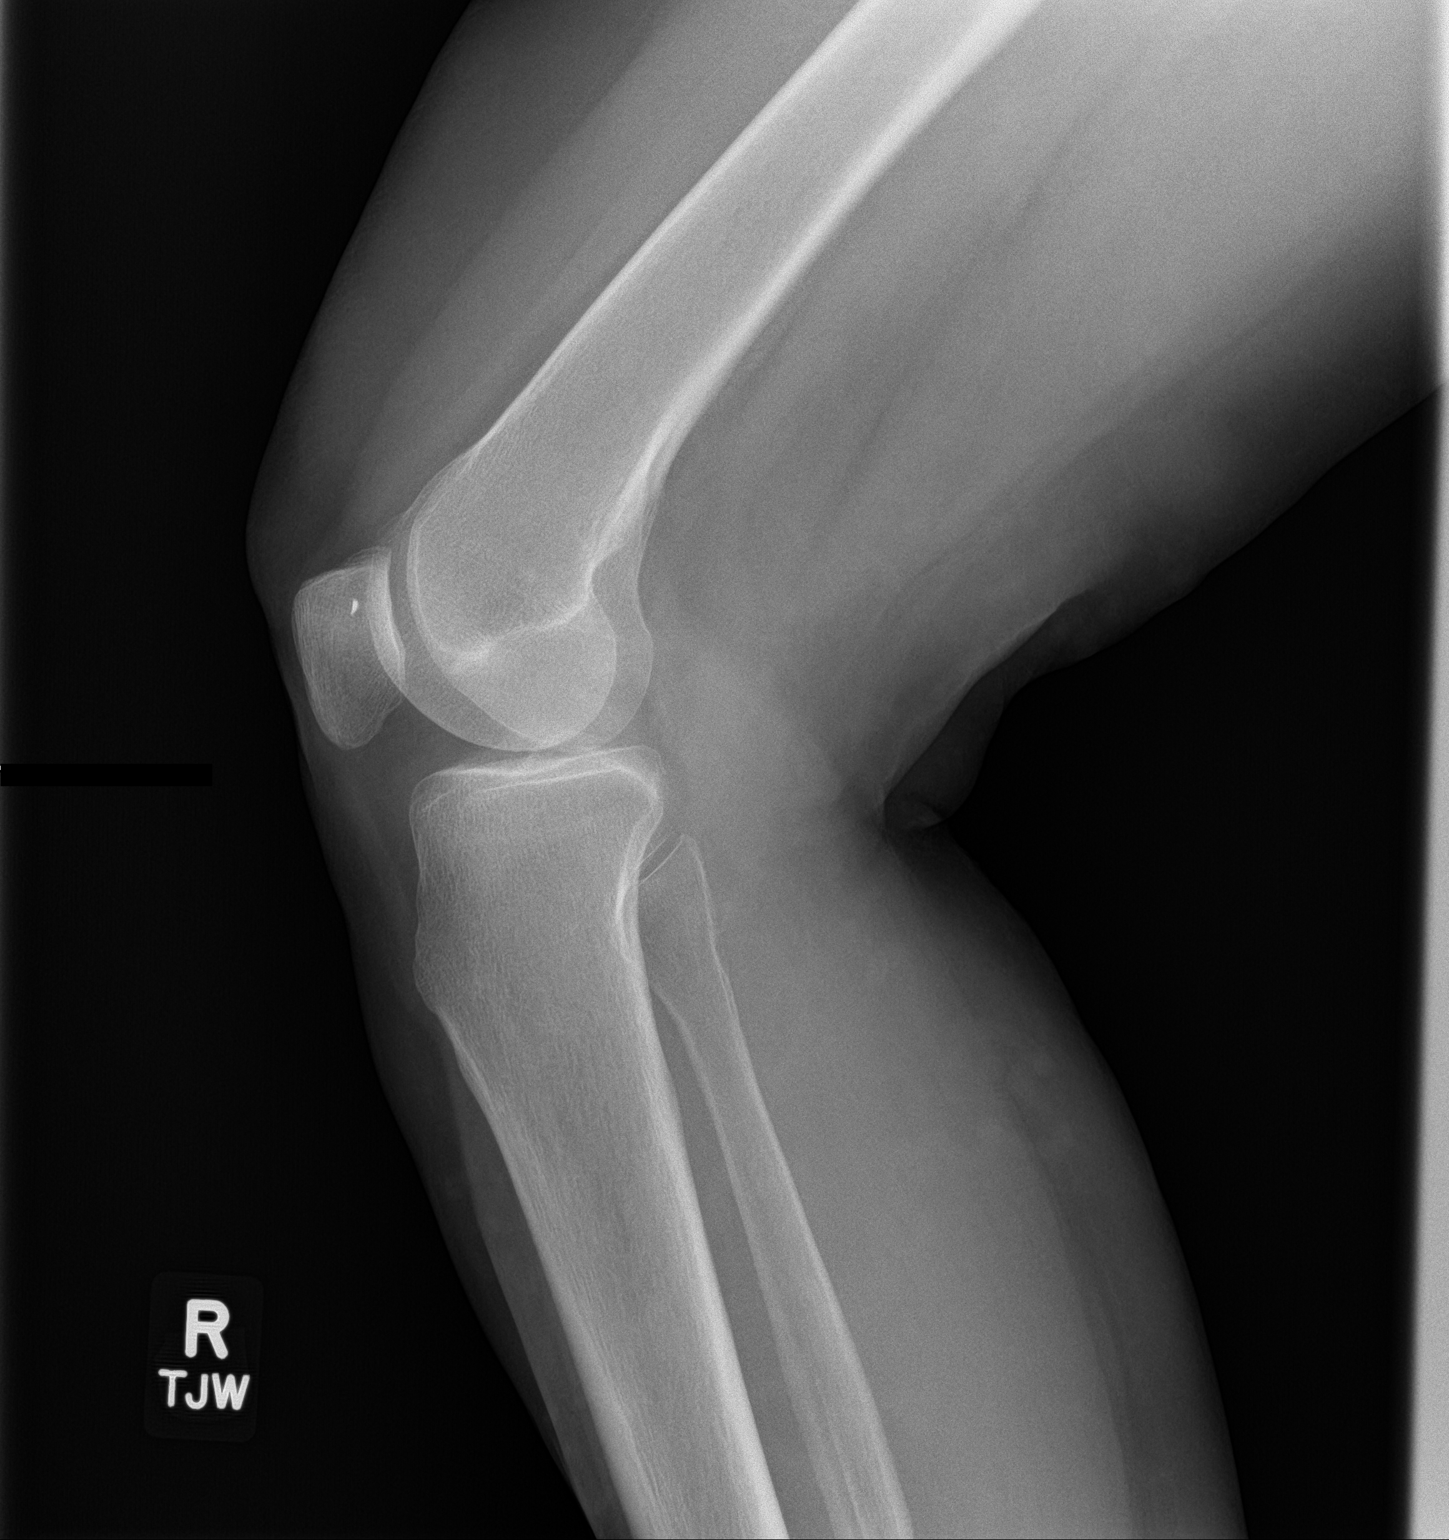

[patella skyline]
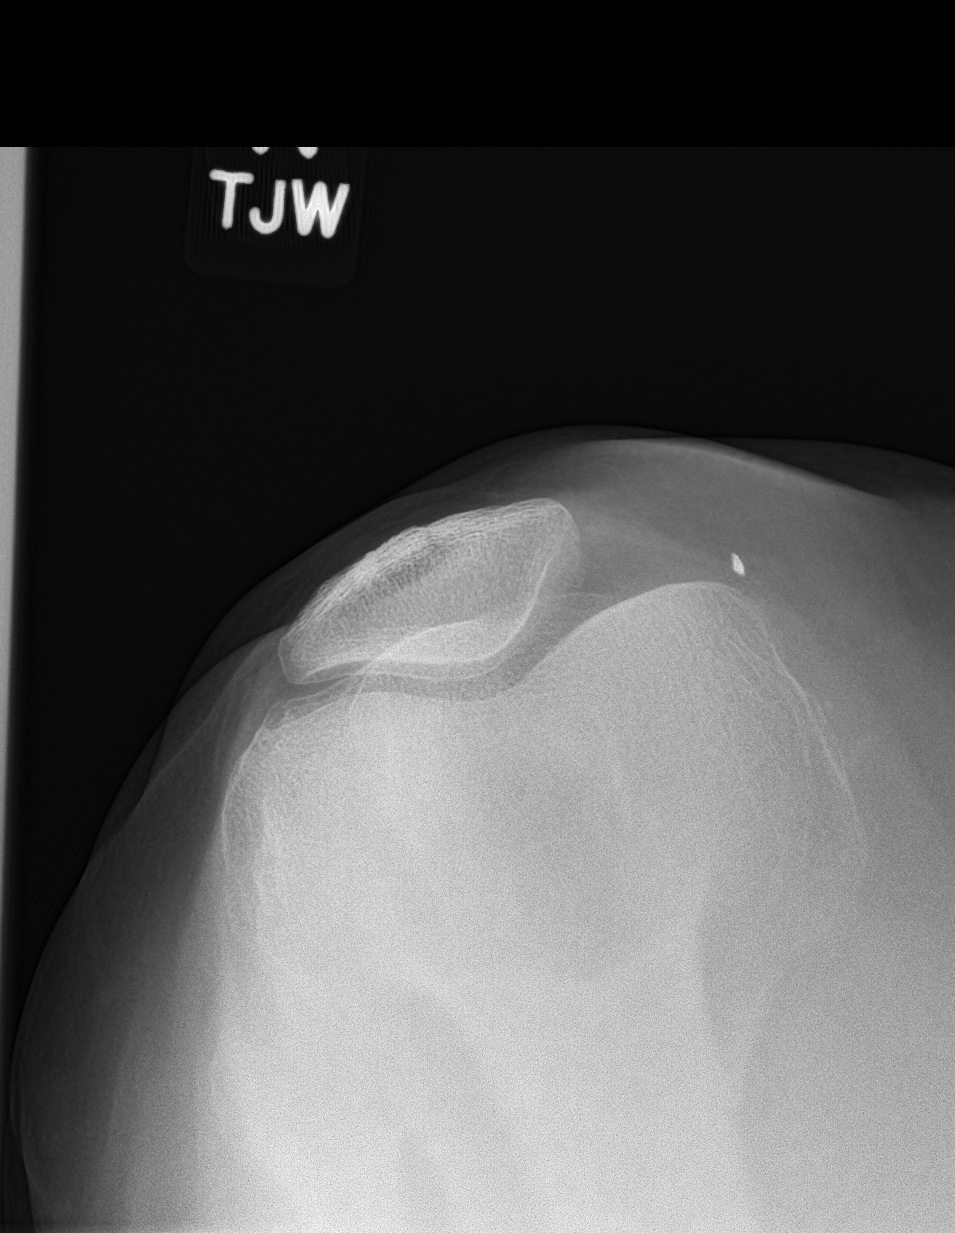

[3 of 3 positions shown; findings below may reference images not displayed]

FINDINGS: No fracture. No subluxation or dislocation. No joint effusion. Mild
loss of joint space noted medial compartment. Tiny radiopaque
foreign body identified in the medial soft tissues of the anterior
knee.
IMPRESSION: 1. No acute bony abnormality.
2. Tiny radiopaque foreign body in the medial soft tissues of the
anterior knee.

## 2021-09-28 ENCOUNTER — Ambulatory Visit (INDEPENDENT_AMBULATORY_CARE_PROVIDER_SITE_OTHER): Payer: BC Managed Care – PPO | Admitting: Primary Care

## 2021-09-28 ENCOUNTER — Encounter: Payer: Self-pay | Admitting: Primary Care

## 2021-09-28 ENCOUNTER — Other Ambulatory Visit: Payer: Self-pay

## 2021-09-28 VITALS — BP 118/67 | HR 84 | Temp 97.4°F | Ht 67.5 in | Wt 181.0 lb

## 2021-09-28 DIAGNOSIS — F4321 Adjustment disorder with depressed mood: Secondary | ICD-10-CM

## 2021-09-28 DIAGNOSIS — R053 Chronic cough: Secondary | ICD-10-CM | POA: Diagnosis not present

## 2021-09-28 MED ORDER — CETIRIZINE HCL 10 MG PO TABS: 10.0000 mg | ORAL_TABLET | Freq: Every day | ORAL | 0 refills | Status: DC

## 2021-09-28 MED ORDER — OMEPRAZOLE 40 MG PO CPDR: 40.0000 mg | DELAYED_RELEASE_CAPSULE | Freq: Every day | ORAL | 0 refills | Status: DC

## 2021-09-28 MED ORDER — FLUOXETINE HCL 10 MG PO CAPS: ORAL_CAPSULE | ORAL | 0 refills | Status: DC

## 2021-09-28 NOTE — Assessment & Plan Note (Signed)
Agree to re-introduce 10 mg dose of fluoxetine in afternoon. Continue 20 mg during the day.  She's done well on this regimen in the past.

## 2021-09-28 NOTE — Patient Instructions (Signed)
Start taking Zyrtec 10 mg once daily for drainage and cough.  Start taking omeprazole 40 mg once daily for cough.  Increase your Qvar to 2 puffs twice daily for cough.  You will be contacted regarding your referral to ENT.  Please let us know if you have not been contacted within two weeks.   Update me in 1 week! It was a pleasure to see you today!

## 2021-09-28 NOTE — Assessment & Plan Note (Signed)
Improved with Qvar, but continues to cough.  Do suspect inflammation to pharynx from chronic cough to be causing swallowing difficulties.   Increase Qvar to 2 puffs BID. Add omeprazole 40 mg daily. Add Zyrtec 10 mg daily.  Referral placed to ENT. She will update in week.

## 2021-09-28 NOTE — Progress Notes (Signed)
Subjective:    Patient ID: Alice Cherry, female    DOB: Aug 18, 1961, 60 y.o.   MRN: [id number]  HPI  Toshiye Kever is a very pleasant 60 y.o. female who presents today for follow up of persistent cough and to discuss depression.  She was last evaluated virtually on 09/15/21 for progressing, persistent cough that began 6 months prior since contracting Covid-19. During this visit it was recommended she complete a chest xray which showed "minimal right apical scarring" and "questionable COPD changes".   We also started famotidine 20 mg BID for potential reflux induced cough. She she stopped taking after one week as this was ineffective. We then initiated Qvar inhaler to use BID.   Today she endorses the cough is improved. She is no longer coughing at night. She mostly feels like she has something/inflammation in her throat, feels like she cannot swallow like she previously did, has to focus on chewing and swallowing thoroughly. She does notice post nasal drip.   She has choked daily for the last month. She denies pain to her chest when swallowing, esophageal burning.   She would also like add back her afternoon dose of fluoxetine 10 mg. She's done well on fluoxetine 20 mg daily but has found she's "hitting a wall" in the afternoon. She's also concerned about increased symptoms with the upcoming holidays.    Review of Systems  HENT:  Positive for postnasal drip, trouble swallowing and voice change.   Respiratory:  Positive for cough and choking. Negative for shortness of breath and wheezing.   Cardiovascular:  Negative for chest pain.        History reviewed. No pertinent past medical history.  Social History   Socioeconomic History   Marital status: Married    Spouse name: Not on file   Number of children: Not on file   Years of education: Not on file   Highest education level: Not on file  Occupational History   Not on file  Tobacco Use   Smoking status: Never   Smokeless  tobacco: Never  Substance and Sexual Activity   Alcohol use: Not Currently   Drug use: Not Currently   Sexual activity: Not on file  Other Topics Concern   Not on file  Social History Narrative   Not on file   Social Determinants of Health   Financial Resource Strain: Not on file  Food Insecurity: Not on file  Transportation Needs: Not on file  Physical Activity: Not on file  Stress: Not on file  Social Connections: Not on file  Intimate Partner Violence: Not on file    Past Surgical History:  Procedure Laterality Date   ABDOMINAL HYSTERECTOMY  1995   APPENDECTOMY     MASTECTOMY      Family History  Problem Relation Age of Onset   Diabetes Mother    Alzheimer's disease Mother    Skin cancer Father    Cancer Father        multiple myeloma    Allergies  Allergen Reactions   Penicillins     Current Outpatient Medications on File Prior to Visit  Medication Sig Dispense Refill   beclomethasone (QVAR REDIHALER) 80 MCG/ACT inhaler Inhale 1 puff into the lungs 2 (two) times daily. 1 each 0   FLUoxetine (PROZAC) 20 MG capsule Take 1 capsule (20 mg total) by mouth daily. For depression 90 capsule 3   meloxicam (MOBIC) 15 MG tablet Take 15 mg by mouth daily. PRN for Knee pain  No current facility-administered medications on file prior to visit.    BP 118/67   Pulse 84   Temp (!) 97.4 F (36.3 C) (Temporal)   Ht 5' 7.5" (1.715 m)   Wt 181 lb (82.1 kg)   SpO2 97%   BMI 27.93 kg/m  Objective:   Physical Exam Constitutional:      General: She is not in acute distress.    Appearance: She is not ill-appearing.  HENT:     Mouth/Throat:     Pharynx: Pharyngeal swelling present. No oropharyngeal exudate.  Neck:     Comments: Tenderness over anterior neck Cardiovascular:     Rate and Rhythm: Normal rate and regular rhythm.  Pulmonary:     Effort: Pulmonary effort is normal.     Breath sounds: Normal breath sounds.     Comments: Dry cough noted several times  throughout visit Musculoskeletal:     Cervical back: Neck supple.  Neurological:     Mental Status: She is alert.          Assessment & Plan:      This visit occurred during the SARS-CoV-2 public health emergency.  Safety protocols were in place, including screening questions prior to the visit, additional usage of staff PPE, and extensive cleaning of exam room while observing appropriate contact time as indicated for disinfecting solutions.

## 2021-10-18 ENCOUNTER — Encounter: Payer: Self-pay | Admitting: Nurse Practitioner

## 2021-10-18 ENCOUNTER — Ambulatory Visit (INDEPENDENT_AMBULATORY_CARE_PROVIDER_SITE_OTHER): Payer: BC Managed Care – PPO | Admitting: Nurse Practitioner

## 2021-10-18 ENCOUNTER — Encounter: Payer: Self-pay | Admitting: *Deleted

## 2021-10-18 ENCOUNTER — Other Ambulatory Visit: Payer: Self-pay

## 2021-10-18 VITALS — BP 96/72 | HR 78 | Temp 98.2°F | Resp 12 | Ht 67.5 in | Wt 176.0 lb

## 2021-10-18 DIAGNOSIS — R0989 Other specified symptoms and signs involving the circulatory and respiratory systems: Secondary | ICD-10-CM | POA: Diagnosis not present

## 2021-10-18 DIAGNOSIS — R053 Chronic cough: Secondary | ICD-10-CM

## 2021-10-18 MED ORDER — PREDNISONE 20 MG PO TABS: ORAL_TABLET | ORAL | 0 refills | Status: AC

## 2021-10-18 MED ORDER — ALBUTEROL SULFATE HFA 108 (90 BASE) MCG/ACT IN AERS: 2.0000 | INHALATION_SPRAY | Freq: Four times a day (QID) | RESPIRATORY_TRACT | 0 refills | Status: DC | PRN

## 2021-10-18 NOTE — Progress Notes (Signed)
Acute Office Visit  Subjective:    Patient ID: Alice Cherry, female    DOB: 1961/05/22, 60 y.o.   MRN: [id number]  Chief Complaint  Patient presents with   Cough    Persistent, feels like it is getting worse. Yesterday had 4 episodes of intense coughing, gets SOB with these episodes. Makes patient gag and gets severe headache. She does have ENT appointment next week. Has seen PCP for this issue x 2.      Patient is in today for Cough   Symptoms started 3 months ago Was seen 1.5 weeks ago by PCP Has episodes that she is coughing to the point of gagging and having a severe headache. Also having increased fatigue Does not bother her at night. No inciting or palliative events per report  Has been using QVAR 2 puffs in the morning and 2 puffs in the evening, with out much benefit. Famoditine was not beneficial, currently on PPI therapy  No shob per say just a feeling of something in her throat. Does endorse shortness of breath when she is having a coughing episode. She had to leave work yesterday because of it. Has appointment with ENT next week. Symptoms are progressing over the past three weeks. States that it feels more like her throat versus her lungs   No past medical history on file.  Past Surgical History:  Procedure Laterality Date   ABDOMINAL HYSTERECTOMY  1995   APPENDECTOMY     MASTECTOMY      Family History  Problem Relation Age of Onset   Diabetes Mother    Alzheimer's disease Mother    Skin cancer Father    Cancer Father        multiple myeloma    Social History   Socioeconomic History   Marital status: Married    Spouse name: Not on file   Number of children: Not on file   Years of education: Not on file   Highest education level: Not on file  Occupational History   Not on file  Tobacco Use   Smoking status: Never   Smokeless tobacco: Never  Substance and Sexual Activity   Alcohol use: Not Currently   Drug use: Not Currently   Sexual activity:  Not on file  Other Topics Concern   Not on file  Social History Narrative   Not on file   Social Determinants of Health   Financial Resource Strain: Not on file  Food Insecurity: Not on file  Transportation Needs: Not on file  Physical Activity: Not on file  Stress: Not on file  Social Connections: Not on file  Intimate Partner Violence: Not on file    Outpatient Medications Prior to Visit  Medication Sig Dispense Refill   beclomethasone (QVAR REDIHALER) 80 MCG/ACT inhaler Inhale 1 puff into the lungs 2 (two) times daily. 1 each 0   cetirizine (ZYRTEC) 10 MG tablet Take 1 tablet (10 mg total) by mouth daily. For drainage and cough 30 tablet 0   FLUoxetine (PROZAC) 10 MG capsule Take 1 capsule by mouth every afternoon for anxiety and depression. 90 capsule 0   FLUoxetine (PROZAC) 20 MG capsule Take 1 capsule (20 mg total) by mouth daily. For depression 90 capsule 3   omeprazole (PRILOSEC) 40 MG capsule Take 1 capsule (40 mg total) by mouth daily. For cough 30 capsule 0   meloxicam (MOBIC) 15 MG tablet Take 15 mg by mouth daily. PRN for Knee pain (Patient not taking: Reported on 10/18/2021)  No facility-administered medications prior to visit.    Allergies  Allergen Reactions   Penicillins     Review of Systems  Constitutional:  Positive for fatigue. Negative for chills and fever.  HENT:  Negative for sore throat (globus sensation).   Respiratory:  Positive for cough. Negative for shortness of breath and wheezing.   Cardiovascular:  Negative for chest pain.  Gastrointestinal:  Negative for nausea and vomiting.      Objective:    Physical Exam Vitals and nursing note reviewed.  Constitutional:      Appearance: Normal appearance.  HENT:     Right Ear: Tympanic membrane, ear canal and external ear normal.     Left Ear: Tympanic membrane, ear canal and external ear normal.     Mouth/Throat:     Mouth: Mucous membranes are moist.     Pharynx: Posterior oropharyngeal  erythema present.  Neck:     Thyroid: No thyroid mass, thyromegaly or thyroid tenderness.  Cardiovascular:     Rate and Rhythm: Normal rate and regular rhythm.  Pulmonary:     Effort: Pulmonary effort is normal.     Breath sounds: Normal breath sounds.  Lymphadenopathy:     Cervical: No cervical adenopathy.  Neurological:     Mental Status: She is alert.  Psychiatric:        Mood and Affect: Mood normal.        Behavior: Behavior normal.        Thought Content: Thought content normal.        Judgment: Judgment normal.    BP 96/72   Pulse 78   Temp 98.2 F (36.8 C)   Resp 12   Ht 5' 7.5" (1.715 m)   Wt 176 lb (79.8 kg)   SpO2 97%   BMI 27.16 kg/m  Wt Readings from Last 3 Encounters:  10/18/21 176 lb (79.8 kg)  09/28/21 181 lb (82.1 kg)  09/15/21 172 lb (78 kg)    Health Maintenance Due  Topic Date Due   Pneumococcal Vaccine 30-41 Years old (1 - PCV) Never done   HIV Screening  Never done    There are no preventive care reminders to display for this patient.   No results found for: TSH Lab Results  Component Value Date   WBC 5.8 09/18/2021   HGB 12.1 09/18/2021   HCT 37.0 09/18/2021   MCV 85.1 09/18/2021   PLT 238.0 09/18/2021   Lab Results  Component Value Date   NA 137 09/18/2021   K 4.1 09/18/2021   CO2 28 09/18/2021   GLUCOSE 93 09/18/2021   BUN 21 09/18/2021   CREATININE 0.76 09/18/2021   BILITOT 0.6 03/09/2021   ALKPHOS 52 03/09/2021   AST 26 03/09/2021   ALT 20 03/09/2021   PROT 7.6 03/09/2021   ALBUMIN 4.6 03/09/2021   CALCIUM 9.3 09/18/2021   GFR 85.06 09/18/2021   Lab Results  Component Value Date   CHOL 210 (H) 03/09/2021   Lab Results  Component Value Date   HDL 60.80 03/09/2021   Lab Results  Component Value Date   LDLCALC 136 (H) 03/09/2021   Lab Results  Component Value Date   TRIG 65.0 03/09/2021   Lab Results  Component Value Date   CHOLHDL 3 03/09/2021   No results found for: HGBA1C     Assessment & Plan:    Problem List Items Addressed This Visit       Other   Persistent cough for 3 weeks or  longer - Primary    Patient has been on many treatments inclusive of an ICS and a chest x-ray done which showed little involvement with her lungs.  States she did have lung disease as a kid but symptoms grew out of it last flareup was when she first married her spouse, years ago.  We will try a short course of prednisone along with an albuterol inhaler as needed patient does have an ENT appointment next week we will get her in tomorrow with [facility]      Relevant Medications   predniSONE (DELTASONE) 20 MG tablet   albuterol (VENTOLIN HFA) 108 (90 Base) MCG/ACT inhaler   Globus sensation    Patient feels like there is a "hook" in her throat that is causing the sensation in likely the culprit of her coughing.  Does have an appointment with ENT.  Patient has been tried on PPIs, H2 blockers, ICS, and now prednisone.  Follow-up with ENT follow-up with [place] if needed  Prednisone precautions were explained inclusive if she is may not take the meloxicam while she is on the prednisone.  Needs to take it with food.  Patient and spouse acknowledged.      Relevant Medications   predniSONE (DELTASONE) 20 MG tablet   30 minutes were devoted to patient care in this encounter (this includes time spent reviewing the patient's file/history, interviewing and examining the patient, counseling/reviewing plan with patient).    No orders of the defined types were placed in this encounter.  This visit occurred during the SARS-CoV-2 public health emergency.  Safety protocols were in place, including screening questions prior to the visit, additional usage of staff PPE, and extensive cleaning of exam room while observing appropriate contact time as indicated for disinfecting solutions.   Romilda Garret, NP

## 2021-10-18 NOTE — Assessment & Plan Note (Signed)
Patient feels like there is a "hook" in her throat that is causing the sensation in likely the culprit of her coughing.  Does have an appointment with ENT.  Patient has been tried on PPIs, H2 blockers, ICS, and now prednisone.  Follow-up with ENT follow-up with [place] if needed  Prednisone precautions were explained inclusive if she is may not take the meloxicam while she is on the prednisone.  Needs to take it with food.  Patient and spouse acknowledged.

## 2021-10-18 NOTE — Patient Instructions (Signed)
Nice to see you today Try the medications. Keep the appointment with ENT. I will reach out to our referral coordinator to see about getting you into [facility] sooner DO NOT take the meloxicam or any other NSAID such as Ibuprofen, aleve, naproxen, motrin or BC/Goody powders

## 2021-10-18 NOTE — Assessment & Plan Note (Signed)
Patient has been on many treatments inclusive of an ICS and a chest x-ray done which showed little involvement with her lungs.  States she did have lung disease as a kid but symptoms grew out of it last flareup was when she first married her spouse, years ago.  We will try a short course of prednisone along with an albuterol inhaler as needed patient does have an ENT appointment next week we will get her in tomorrow with [facility] ENT

## 2021-10-20 ENCOUNTER — Other Ambulatory Visit: Payer: Self-pay | Admitting: Primary Care

## 2021-10-20 DIAGNOSIS — R053 Chronic cough: Secondary | ICD-10-CM

## 2021-11-10 ENCOUNTER — Encounter: Payer: Self-pay | Admitting: Nurse Practitioner

## 2021-11-10 ENCOUNTER — Telehealth (INDEPENDENT_AMBULATORY_CARE_PROVIDER_SITE_OTHER): Payer: 59 | Admitting: Nurse Practitioner

## 2021-11-10 ENCOUNTER — Other Ambulatory Visit: Payer: Self-pay

## 2021-11-10 DIAGNOSIS — R0982 Postnasal drip: Secondary | ICD-10-CM

## 2021-11-10 DIAGNOSIS — R053 Chronic cough: Secondary | ICD-10-CM | POA: Diagnosis not present

## 2021-11-10 MED ORDER — FLUTICASONE PROPIONATE 50 MCG/ACT NA SUSP: 2.0000 | Freq: Every day | NASAL | 0 refills | Status: DC

## 2021-11-10 NOTE — Assessment & Plan Note (Signed)
Report of postnasal drip.  Will send in some Flonase.

## 2021-11-10 NOTE — Assessment & Plan Note (Signed)
We will send patient to pulmonology also put patient on fluticasone as she is endorsing slight postnasal drip.  Did offer to place her on a short course of steroids to lighten the load over the holidays patient declined at current time.

## 2021-11-10 NOTE — Progress Notes (Signed)
Patient ID: Alice Cherry, female    DOB: November 09, 1961, 60 y.o.   MRN: [id number]  Virtual visit completed through [place], a video enabled telemedicine application. Due to national recommendations of social distancing due to COVID-19, a virtual visit is felt to be most appropriate for this patient at this time. Reviewed limitations, risks, security and privacy concerns of performing a virtual visit and the availability of in person appointments. I also reviewed that there may be a patient responsible charge related to this service. The patient agreed to proceed.   Patient location: home Provider location: [place] at [facility], office Persons participating in this virtual visit: patient, provider   If any vitals were documented, they were collected by patient at home unless specified below.    There were no vitals taken for this visit.   CC: Cough Subjective:   HPI: Alice Cherry is a 60 y.o. female presenting on 11/10/2021 for Cough (Follow up. Its a little bit better but still present.)  Was evaluated by ENT and had the light to go down her throat and states that it looked normal. Has been following GERD plan and helped some. Is on ompeprazole.  Last office visit with me we placed patient on steroids and had good relief per report. Does not want to be on steroids consistently.   States as a child she had bronchitis all the time.  Has been evaluated previously by her PCP who has tried antihistamine, SABA, ICS, PPI.  Had CXR performed that did show "hyperinflation with mild bronchitic changes question COPD". She a non smoker, has never smoked and no history of family lung disease per report       Relevant past medical, surgical, family and social history reviewed and updated as indicated. Interim medical history since our last visit reviewed. Allergies and medications reviewed and updated. Outpatient Medications Prior to Visit  Medication Sig Dispense Refill   albuterol (VENTOLIN  HFA) 108 (90 Base) MCG/ACT inhaler Inhale 2 puffs into the lungs every 6 (six) hours as needed for wheezing or shortness of breath. 8 g 0   cetirizine (ZYRTEC) 10 MG tablet TAKE 1 TABLET (10 MG TOTAL) BY MOUTH DAILY. FOR DRAINAGE AND COUGH 90 tablet 0   FLUoxetine (PROZAC) 10 MG capsule Take 1 capsule by mouth every afternoon for anxiety and depression. 90 capsule 0   FLUoxetine (PROZAC) 20 MG capsule Take 1 capsule (20 mg total) by mouth daily. For depression 90 capsule 3   meloxicam (MOBIC) 15 MG tablet Take 15 mg by mouth daily. PRN for Knee pain     omeprazole (PRILOSEC) 40 MG capsule TAKE 1 CAPSULE (40 MG TOTAL) BY MOUTH DAILY. FOR COUGH 30 capsule 0   No facility-administered medications prior to visit.     Per HPI unless specifically indicated in ROS section below Review of Systems  Constitutional:  Negative for chills and fever.  Respiratory:  Positive for cough. Negative for shortness of breath.   Cardiovascular:  Negative for chest pain.  Gastrointestinal:  Negative for abdominal pain, diarrhea, nausea and vomiting.  Objective:  There were no vitals taken for this visit.  Wt Readings from Last 3 Encounters:  10/18/21 176 lb (79.8 kg)  09/28/21 181 lb (82.1 kg)  09/15/21 172 lb (78 kg)       Physical exam: Gen: alert, NAD, not ill appearing Pulm: speaks in complete sentences without increased work of breathing Psych: normal mood, normal thought content      Results for orders  placed or performed in visit on 65/46/50  Basic metabolic panel  Result Value Ref Range   Sodium 137 135 - 145 mEq/L   Potassium 4.1 3.5 - 5.1 mEq/L   Chloride 101 96 - 112 mEq/L   CO2 28 19 - 32 mEq/L   Glucose, Bld 93 70 - 99 mg/dL   BUN 21 6 - 23 mg/dL   Creatinine, Ser 0.76 0.40 - 1.20 mg/dL   GFR 85.06 >60.00 mL/min   Calcium 9.3 8.4 - 10.5 mg/dL  CBC with Differential/Platelet  Result Value Ref Range   WBC 5.8 4.0 - 10.5 K/uL   RBC 4.35 3.87 - 5.11 Mil/uL   Hemoglobin 12.1 12.0 -  15.0 g/dL   HCT 37.0 36.0 - 46.0 %   MCV 85.1 78.0 - 100.0 fl   MCHC 32.6 30.0 - 36.0 g/dL   RDW 14.3 11.5 - 15.5 %   Platelets 238.0 150.0 - 400.0 K/uL   Neutrophils Relative % 64.1 43.0 - 77.0 %   Lymphocytes Relative 26.7 12.0 - 46.0 %   Monocytes Relative 7.3 3.0 - 12.0 %   Eosinophils Relative 1.5 0.0 - 5.0 %   Basophils Relative 0.4 0.0 - 3.0 %   Neutro Abs 3.7 1.4 - 7.7 K/uL   Lymphs Abs 1.5 0.7 - 4.0 K/uL   Monocytes Absolute 0.4 0.1 - 1.0 K/uL   Eosinophils Absolute 0.1 0.0 - 0.7 K/uL   Basophils Absolute 0.0 0.0 - 0.1 K/uL   Assessment & Plan:   Problem List Items Addressed This Visit       Other   Persistent cough for 3 weeks or longer    We will send patient to pulmonology also put patient on fluticasone as she is endorsing slight postnasal drip.  Did offer to place her on a short course of steroids to lighten the load over the holidays patient declined at current time.      Relevant Orders   Ambulatory referral to Pulmonology   PND (post-nasal drip) - Primary    Report of postnasal drip.  Will send in some Flonase.      Relevant Medications   fluticasone (FLONASE) 50 MCG/ACT nasal spray     No orders of the defined types were placed in this encounter.  No orders of the defined types were placed in this encounter.   I discussed the assessment and treatment plan with the patient. The patient was provided an opportunity to ask questions and all were answered. The patient agreed with the plan and demonstrated an understanding of the instructions. The patient was advised to call back or seek an in-person evaluation if the symptoms worsen or if the condition fails to improve as anticipated.  Follow up plan: No follow-ups on file.  Alice Garret, NP

## 2021-11-14 ENCOUNTER — Other Ambulatory Visit: Payer: Self-pay | Admitting: Primary Care

## 2021-11-14 DIAGNOSIS — R053 Chronic cough: Secondary | ICD-10-CM

## 2021-11-27 DIAGNOSIS — R053 Chronic cough: Secondary | ICD-10-CM

## 2021-11-28 MED ORDER — BUDESONIDE-FORMOTEROL FUMARATE 80-4.5 MCG/ACT IN AERO
1.0000 | INHALATION_SPRAY | Freq: Two times a day (BID) | RESPIRATORY_TRACT | 0 refills | Status: DC
Start: 2021-11-28 — End: 2021-12-06

## 2021-11-28 NOTE — Telephone Encounter (Signed)
Alice Cherry,  Is there any chance we can get this patient in with pulmonology sooner than 12/25/21?

## 2021-11-29 NOTE — Telephone Encounter (Signed)
Im sorry, No, not without it being marked Urgent. That is unfortunately their first available.   The patient can call to check for cancellations and request that she be added to the cancellation list.

## 2021-11-29 NOTE — Telephone Encounter (Signed)
Noted, new stat referral placed.

## 2021-12-01 NOTE — Telephone Encounter (Signed)
Thank you :)

## 2021-12-01 NOTE — Telephone Encounter (Signed)
Pt husband called and said that the soonest appt they could get was feb 6th and they want something sooner even if they have to go somewhere else.

## 2021-12-01 NOTE — Telephone Encounter (Signed)
Pt is aware.  I was able to her her appt moved up to Wednesday, 12/06/2021 with Dr. Loanne Drilling at the [facility] office.   [facility] [place], [address], [place] [phone]

## 2021-12-06 ENCOUNTER — Other Ambulatory Visit: Payer: Self-pay

## 2021-12-06 ENCOUNTER — Ambulatory Visit (INDEPENDENT_AMBULATORY_CARE_PROVIDER_SITE_OTHER): Payer: 59 | Admitting: Pulmonary Disease

## 2021-12-06 ENCOUNTER — Encounter: Payer: Self-pay | Admitting: Pulmonary Disease

## 2021-12-06 VITALS — BP 120/72 | HR 94 | Temp 98.3°F | Ht 68.0 in | Wt 169.0 lb

## 2021-12-06 DIAGNOSIS — R053 Chronic cough: Secondary | ICD-10-CM | POA: Diagnosis not present

## 2021-12-06 MED ORDER — BUDESONIDE-FORMOTEROL FUMARATE 160-4.5 MCG/ACT IN AERO: 2.0000 | INHALATION_SPRAY | Freq: Two times a day (BID) | RESPIRATORY_TRACT | 5 refills | Status: DC

## 2021-12-06 NOTE — Progress Notes (Signed)
Subjective:   PATIENT ID: Alice Cherry GENDER: female DOB: 09/07/1961, MRN: [id number]   HPI  Chief Complaint  Patient presents with   Consult    Consult for persistant cough that started in early October. Some mucus production at time with clear in color. 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  She has had chronic cough that began in October 2022 (four months) that has progressively worsened. Cough is gagging and occurs daily. Exertion will worsen it including walking upstairs. Cold air has worsens it. Will also be aggravated while eating. Denies heartburn and chest pressure. Has post-tussive emesis. This interrupts her when she is talking at work. 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 The cough is episodic 3-4 times a day and wont stop for up to a minute. Sometimes she feels like she can't breathe and wheezing. Will normalize after a few days but she feels exhausted. Unable to sing a full song.   Laying down improves her symptoms. Does not awaken her but has started coughing at night.  Hx childhood chronic bronchitis requiring multiple antibiotic courses. Started from 70 to 30 years old. Less symptoms after reaching her 48s.  Seen by PCP and has been using Symbicort for two weeks and does not feel like it is effective. Prednisone course completely resolved her symptoms. She is on omeprazole 40 mg and has decreased her caffeine intake with no change. She uses flonase 1 spray per nare once a day. She has been seen by ENT 10/19/21 with normal exam.   Social History: Educational administration Frequent childhood exacerbations Mother - asthma Son passed away 2 years ago - Nature conservation officer  I have personally reviewed patient's past  medical/family/social history, allergies, current medications.  Past Medical History:  Diagnosis Date   Breast cancer ([place])    left side     Family History  Problem Relation Age of Onset   Diabetes Mother    Alzheimer's disease Mother    Skin cancer Father    Cancer Father        multiple myeloma     Social History   Occupational History   Not on file  Tobacco Use   Smoking status: Never   Smokeless tobacco: Never  Substance and Sexual Activity   Alcohol use: Not Currently   Drug use: Not Currently   Sexual activity: Not on file    Allergies  Allergen Reactions   Penicillins      Outpatient Medications Prior to Visit  Medication Sig Dispense Refill   albuterol (VENTOLIN HFA) 108 (90 Base) MCG/ACT inhaler Inhale 2 puffs into the lungs every 6 (six) hours as needed for wheezing or shortness of breath. 8 g 0   budesonide-formoterol (SYMBICORT) 80-4.5 MCG/ACT inhaler Inhale 1-2 puffs into the lungs 2 (two) times daily. 1 each 0   cetirizine (ZYRTEC) 10 MG tablet TAKE 1 TABLET (10 MG TOTAL) BY MOUTH DAILY. FOR DRAINAGE AND COUGH 90 tablet 0   FLUoxetine (PROZAC) 10 MG capsule Take 1 capsule by mouth every afternoon for anxiety and depression. 90 capsule 0   FLUoxetine (PROZAC) 20 MG capsule Take 1 capsule (20 mg total) by mouth daily. For depression 90 capsule 3   fluticasone (FLONASE) 50 MCG/ACT nasal  spray Place 2 sprays into both nostrils daily. 16 g 0   meloxicam (MOBIC) 15 MG tablet Take 15 mg by mouth daily. PRN for Knee pain     omeprazole (PRILOSEC) 40 MG capsule TAKE 1 CAPSULE (40 MG TOTAL) BY MOUTH DAILY. FOR COUGH 90 capsule 0   No facility-administered medications prior to visit.    Review of Systems  Constitutional:  Negative for chills, diaphoresis, fever, malaise/fatigue and weight loss.  HENT:  Negative for congestion, ear pain and sore throat.   Respiratory:  Positive for cough and shortness of breath. Negative for hemoptysis, sputum production and  wheezing.   Cardiovascular:  Negative for chest pain, palpitations and leg swelling.  Gastrointestinal:  Positive for vomiting. Negative for abdominal pain, heartburn and nausea.  Genitourinary:  Negative for frequency.  Musculoskeletal:  Negative for joint pain and myalgias.  Skin:  Negative for itching and rash.  Neurological:  Negative for dizziness, weakness and headaches.  Endo/Heme/Allergies:  Does not bruise/bleed easily.  Psychiatric/Behavioral:  Negative for depression. The patient is not nervous/anxious.     Objective:   Vitals:   12/06/21 1356  BP: 120/72  Pulse: 94  Temp: 98.3 F (36.8 C)  TempSrc: Oral  SpO2: 99%  Weight: 169 lb (76.7 kg)  Height: _0  (1.727 m)      Physical Exam: General: Well-appearing, no acute distress HENT: [place], AT Eyes: EOMI, no scleral icterus Respiratory: Clear to auscultation bilaterally.  No crackles, wheezing or rales Cardiovascular: RRR, -M/R/G, no JVD Extremities:-Edema,-tenderness Neuro: AAO x4, CNII-XII grossly intact Psych: Normal mood, normal affect  Data Reviewed:  Imaging: CXR 09/18/21 - No infiltrate effusion or edema. Mild bronchitic changes. Minimal right apical scarring  PFT: None on file  Labs: CBC    Component Value Date/Time   WBC 5.8 09/18/2021 1044   RBC 4.35 09/18/2021 1044   HGB 12.1 09/18/2021 1044   HCT 37.0 09/18/2021 1044   PLT 238.0 09/18/2021 1044   MCV 85.1 09/18/2021 1044   MCHC 32.6 09/18/2021 1044   RDW 14.3 09/18/2021 1044   LYMPHSABS 1.5 09/18/2021 1044   MONOABS 0.4 09/18/2021 1044   EOSABS 0.1 09/18/2021 1044   BASOSABS 0.0 09/18/2021 1044   Absolute eos 09/18/21 - 100     Assessment & Plan:   Discussion: 61 year old female never smoker with childhood bronchitis, history of breast cancer s/p left mastectomy and allergic rhinitis who presents for evaluation of chronic cough. Common causes of cough were discussed including upper airway cough syndrome, reflux and undiagnosed  obstructive lung disease. Will trial increased dose of ICS/LABA until PFTs can be completed.  Chronic cough --ARRANGE for pulmonary function tests --INCREASE Symbicort 160-4.5 TWO puffs TWICE a day --REFER to GI  Health Maintenance Immunization History  Administered Date(s) Administered   Influenza,inj,Quad PF,6+ Mos 08/01/2016, 09/19/2017, 07/28/2018   Influenza-Unspecified 08/22/2020   PFIZER(Purple Top)SARS-COV-2 Vaccination 01/19/2020, 02/16/2020, 10/14/2020   Tdap 04/20/2011, 03/09/2021   Zoster Recombinat (Shingrix) 09/12/2019, 12/12/2019   CT Lung Screen - not qualified. Never smoker  Orders Placed This Encounter  Procedures   Ambulatory referral to Gastroenterology    Referral Priority:   Routine    Referral Type:   Consultation    Referral Reason:   Specialty Services Required    Number of Visits Requested:   1   Meds ordered this encounter  Medications   budesonide-formoterol (SYMBICORT) 160-4.5 MCG/ACT inhaler    Sig: Inhale 2 puffs into the lungs in the morning and at bedtime.  Dispense:  1 each    Refill:  5    No follow-ups on file. After PFTs  I have spent a total time of 45-minutes on the day of the appointment reviewing prior documentation, coordinating care and discussing medical diagnosis and plan with the patient/family. Imaging, labs and tests included in this note have been reviewed and interpreted independently by me.  [place], MD [facility] 12/06/2021 1:05 PM  Office Number [phone]

## 2021-12-06 NOTE — Patient Instructions (Addendum)
Chronic cough --ARRANGE for pulmonary function tests --INCREASE Symbicort 160-4.5 TWO puffs TWICE a day --REFER to GI  Follow-up with me for PFTs

## 2021-12-12 ENCOUNTER — Encounter: Payer: Self-pay | Admitting: Pulmonary Disease

## 2021-12-13 ENCOUNTER — Other Ambulatory Visit: Payer: Self-pay

## 2021-12-13 ENCOUNTER — Telehealth (INDEPENDENT_AMBULATORY_CARE_PROVIDER_SITE_OTHER): Payer: 59 | Admitting: Primary Care

## 2021-12-13 VITALS — Wt 170.0 lb

## 2021-12-13 DIAGNOSIS — R053 Chronic cough: Secondary | ICD-10-CM | POA: Diagnosis not present

## 2021-12-13 MED ORDER — PREDNISONE 20 MG PO TABS: ORAL_TABLET | ORAL | 0 refills | Status: DC

## 2021-12-13 MED ORDER — AZITHROMYCIN 250 MG PO TABS
ORAL_TABLET | ORAL | 0 refills | Status: DC
Start: 2021-12-13 — End: 2021-12-25

## 2021-12-13 NOTE — Assessment & Plan Note (Addendum)
Failed treatment with Symbicort, omeprazole 40 mg, Zyrtec 10 mg.  Given change in sputum production, will treat.  Prescription for azithromycin to 250 mg tablets sent to pharmacy.  Prescription for prednisone 40 mg to take daily for 5 days sent to pharmacy.  She will follow-up with pulmonology for PFTs. She will see GI in mid February.  Office visit from pulmonology reviewed in chart.

## 2021-12-13 NOTE — Progress Notes (Signed)
Patient ID: Alice Cherry, female    DOB: Mar 15, 1961, 61 y.o.   MRN: [id number]  Virtual visit completed through [employer], a video enabled telemedicine application. Due to national recommendations of social distancing due to COVID-19, a virtual visit is felt to be most appropriate for this patient at this time. Reviewed limitations, risks, security and privacy concerns of performing a virtual visit and the availability of in person appointments. I also reviewed that there may be a patient responsible charge related to this service. The patient agreed to proceed.   Patient location: home Provider location: [place] [facility], office Persons participating in this virtual visit: patient, provider   If any vitals were documented, they were collected by patient at home unless specified below.    Wt 170 lb (77.1 kg)    BMI 25.85 kg/m    CC: Acute on chronic cough Subjective:   HPI: Alice Cherry is a 61 y.o. female with a history of chronic cough, globus sensation, postnasal drip presenting on 12/13/2021 for acute on chronic cough.   Chronic cough since October 2022.  Evaluated by PCP, ENT, and now pulmonology.  She is pending pulmonary function testing for further insight.  She has also been referred to GI.  She has not tolerated the increased dose of Symbicort 160-4.5 mcg that was provided at her recent pulmonology visit, causes persistent nausea.  She is compliant to her omeprazole and Zyrtec.  Two months ago her cough became more violent to the point where she is vomiting daily.  Her sputum changed to a green color 4 days ago with deeper cough and voice hoarseness. She is leaving town tomorrow, is concerned as her cough has progressed.  The only treatment that has provided relief was prednisone.  This was temporary relief.     Relevant past medical, surgical, family and social history reviewed and updated as indicated. Interim medical history since our last visit reviewed. Allergies  and medications reviewed and updated. Outpatient Medications Prior to Visit  Medication Sig Dispense Refill   cetirizine (ZYRTEC) 10 MG tablet TAKE 1 TABLET (10 MG TOTAL) BY MOUTH DAILY. FOR DRAINAGE AND COUGH 90 tablet 0   fluticasone (FLONASE) 50 MCG/ACT nasal spray Place 2 sprays into both nostrils daily. 16 g 0   meloxicam (MOBIC) 15 MG tablet Take 15 mg by mouth daily. PRN for Knee pain     omeprazole (PRILOSEC) 40 MG capsule TAKE 1 CAPSULE (40 MG TOTAL) BY MOUTH DAILY. FOR COUGH 90 capsule 0   albuterol (VENTOLIN HFA) 108 (90 Base) MCG/ACT inhaler Inhale 2 puffs into the lungs every 6 (six) hours as needed for wheezing or shortness of breath. (Patient not taking: Reported on 12/06/2021) 8 g 0   budesonide-formoterol (SYMBICORT) 160-4.5 MCG/ACT inhaler Inhale 2 puffs into the lungs in the morning and at bedtime. 1 each 5   No facility-administered medications prior to visit.     Per HPI unless specifically indicated in ROS section below Review of Systems  Constitutional:  Negative for chills, fatigue and fever.  HENT:  Positive for congestion.   Respiratory:  Positive for cough. Negative for shortness of breath and wheezing.   Objective:  Wt 170 lb (77.1 kg)    BMI 25.85 kg/m   Wt Readings from Last 3 Encounters:  12/13/21 170 lb (77.1 kg)  12/06/21 169 lb (76.7 kg)  10/18/21 176 lb (79.8 kg)       Physical exam: General: Alert and oriented x 3, no distress, does  not appear sickly  Pulmonary: Speaks in complete sentences without increased work of breathing, very deep/congested cough noted throughout visit.  Hoarse voice throughout visit.  Psychiatric: Normal mood, thought content, and behavior.     Results for orders placed or performed in visit on 85/88/50  Basic metabolic panel  Result Value Ref Range   Sodium 137 135 - 145 mEq/L   Potassium 4.1 3.5 - 5.1 mEq/L   Chloride 101 96 - 112 mEq/L   CO2 28 19 - 32 mEq/L   Glucose, Bld 93 70 - 99 mg/dL   BUN 21 6 - 23 mg/dL    Creatinine, Ser 0.76 0.40 - 1.20 mg/dL   GFR 85.06 >60.00 mL/min   Calcium 9.3 8.4 - 10.5 mg/dL  CBC with Differential/Platelet  Result Value Ref Range   WBC 5.8 4.0 - 10.5 K/uL   RBC 4.35 3.87 - 5.11 Mil/uL   Hemoglobin 12.1 12.0 - 15.0 g/dL   HCT 37.0 36.0 - 46.0 %   MCV 85.1 78.0 - 100.0 fl   MCHC 32.6 30.0 - 36.0 g/dL   RDW 14.3 11.5 - 15.5 %   Platelets 238.0 150.0 - 400.0 K/uL   Neutrophils Relative % 64.1 43.0 - 77.0 %   Lymphocytes Relative 26.7 12.0 - 46.0 %   Monocytes Relative 7.3 3.0 - 12.0 %   Eosinophils Relative 1.5 0.0 - 5.0 %   Basophils Relative 0.4 0.0 - 3.0 %   Neutro Abs 3.7 1.4 - 7.7 K/uL   Lymphs Abs 1.5 0.7 - 4.0 K/uL   Monocytes Absolute 0.4 0.1 - 1.0 K/uL   Eosinophils Absolute 0.1 0.0 - 0.7 K/uL   Basophils Absolute 0.0 0.0 - 0.1 K/uL   Assessment & Plan:   Problem List Items Addressed This Visit       Other   Persistent cough for 3 weeks or longer - Primary   Relevant Medications   azithromycin (ZITHROMAX) 250 MG tablet   predniSONE (DELTASONE) 20 MG tablet     Meds ordered this encounter  Medications   azithromycin (ZITHROMAX) 250 MG tablet    Sig: Take 2 tablets by mouth today, then 1 tablet daily for 4 additional days.    Dispense:  6 tablet    Refill:  0    Order Specific Question:   Supervising Provider    Answer:   BEDSOLE, AMY E [[id number]]   predniSONE (DELTASONE) 20 MG tablet    Sig: Take 2 tablets by mouth once daily for 5 days.    Dispense:  10 tablet    Refill:  0    Order Specific Question:   Supervising Provider    Answer:   BEDSOLE, AMY E [[id number]]   No orders of the defined types were placed in this encounter.   I discussed the assessment and treatment plan with the patient. The patient was provided an opportunity to ask questions and all were answered. The patient agreed with the plan and demonstrated an understanding of the instructions. The patient was advised to call back or seek an in-person evaluation if the symptoms  worsen or if the condition fails to improve as anticipated.  Follow up plan:  Start Azithromycin antibiotics for infection. Take 2 tablets by mouth today, then 1 tablet daily for 4 additional days.  Start prednisone 20 mg tablets. Take 2 tablets by mouth once daily for 5 days.  Follow up with pulmonology as scheduled.   It was a pleasure to see you today!  Pleas Koch, NP

## 2021-12-13 NOTE — Patient Instructions (Signed)
Start Azithromycin antibiotics for infection. Take 2 tablets by mouth today, then 1 tablet daily for 4 additional days.  Start prednisone 20 mg tablets. Take 2 tablets by mouth once daily for 5 days.  Follow up with pulmonology as scheduled.   It was a pleasure to see you today!

## 2021-12-21 ENCOUNTER — Other Ambulatory Visit: Payer: Self-pay | Admitting: Primary Care

## 2021-12-21 DIAGNOSIS — R053 Chronic cough: Secondary | ICD-10-CM

## 2021-12-23 ENCOUNTER — Other Ambulatory Visit: Payer: Self-pay | Admitting: Primary Care

## 2021-12-23 DIAGNOSIS — F4321 Adjustment disorder with depressed mood: Secondary | ICD-10-CM

## 2021-12-25 ENCOUNTER — Institutional Professional Consult (permissible substitution): Payer: 59 | Admitting: Internal Medicine

## 2021-12-26 ENCOUNTER — Other Ambulatory Visit: Payer: Self-pay | Admitting: Pulmonary Disease

## 2021-12-26 DIAGNOSIS — R053 Chronic cough: Secondary | ICD-10-CM

## 2021-12-27 ENCOUNTER — Other Ambulatory Visit: Payer: Self-pay

## 2021-12-27 ENCOUNTER — Ambulatory Visit: Payer: 59

## 2021-12-27 DIAGNOSIS — R053 Chronic cough: Secondary | ICD-10-CM

## 2021-12-27 LAB — PULMONARY FUNCTION TEST
DL/VA % pred: 90 %
DL/VA: 3.68 ml/min/mmHg/L
DLCO cor % pred: 71 %
DLCO cor: 16.78 ml/min/mmHg
DLCO unc % pred: 71 %
DLCO unc: 16.78 ml/min/mmHg
FEF 25-75 Post: 3.34 L/sec
FEF 25-75 Pre: 2.53 L/sec
FEF2575-%Change-Post: 32 %
FEF2575-%Pred-Post: 127 %
FEF2575-%Pred-Pre: 96 %
FEV1-%Change-Post: 8 %
FEV1-%Pred-Post: 94 %
FEV1-%Pred-Pre: 86 %
FEV1-Post: 2.82 L
FEV1-Pre: 2.59 L
FEV1FVC-%Change-Post: 8 %
FEV1FVC-%Pred-Pre: 101 %
FEV6-%Change-Post: 0 %
FEV6-%Pred-Post: 87 %
FEV6-%Pred-Pre: 86 %
FEV6-Post: 3.27 L
FEV6-Pre: 3.25 L
FEV6FVC-%Change-Post: 0 %
FEV6FVC-%Pred-Post: 103 %
FEV6FVC-%Pred-Pre: 103 %
FVC-%Change-Post: 0 %
FVC-%Pred-Post: 84 %
FVC-%Pred-Pre: 84 %
FVC-Post: 3.27 L
FVC-Pre: 3.27 L
Post FEV1/FVC ratio: 86 %
Post FEV6/FVC ratio: 100 %
Pre FEV1/FVC ratio: 79 %
Pre FEV6/FVC Ratio: 100 %
RV % pred: 82 %
RV: 1.82 L
TLC % pred: 89 %
TLC: 5.13 L

## 2021-12-29 ENCOUNTER — Encounter: Payer: Self-pay | Admitting: Emergency Medicine

## 2021-12-29 ENCOUNTER — Encounter: Payer: Self-pay | Admitting: Nurse Practitioner

## 2021-12-29 ENCOUNTER — Emergency Department: Payer: 59

## 2021-12-29 ENCOUNTER — Ambulatory Visit (INDEPENDENT_AMBULATORY_CARE_PROVIDER_SITE_OTHER): Payer: 59 | Admitting: Nurse Practitioner

## 2021-12-29 ENCOUNTER — Other Ambulatory Visit: Payer: Self-pay

## 2021-12-29 ENCOUNTER — Emergency Department
Admission: EM | Admit: 2021-12-29 | Discharge: 2021-12-29 | Disposition: A | Discharge status: Home / Self Care | Payer: 59 | Attending: Emergency Medicine | Admitting: Emergency Medicine

## 2021-12-29 VITALS — BP 110/68 | HR 82 | Ht 68.5 in | Wt 175.0 lb

## 2021-12-29 DIAGNOSIS — R0781 Pleurodynia: Secondary | ICD-10-CM | POA: Insufficient documentation

## 2021-12-29 DIAGNOSIS — R0989 Other specified symptoms and signs involving the circulatory and respiratory systems: Secondary | ICD-10-CM | POA: Diagnosis not present

## 2021-12-29 DIAGNOSIS — Z853 Personal history of malignant neoplasm of breast: Secondary | ICD-10-CM | POA: Insufficient documentation

## 2021-12-29 DIAGNOSIS — R059 Cough, unspecified: Secondary | ICD-10-CM | POA: Diagnosis not present

## 2021-12-29 DIAGNOSIS — Z20822 Contact with and (suspected) exposure to covid-19: Secondary | ICD-10-CM | POA: Insufficient documentation

## 2021-12-29 LAB — RESP PANEL BY RT-PCR (FLU A&B, COVID) ARPGX2
Influenza A by PCR: NEGATIVE
Influenza B by PCR: NEGATIVE
SARS Coronavirus 2 by RT PCR: NEGATIVE

## 2021-12-29 MED ORDER — OXYCODONE-ACETAMINOPHEN 5-325 MG PO TABS: 1.0000 | ORAL_TABLET | Freq: Four times a day (QID) | ORAL | 0 refills | Status: DC | PRN

## 2021-12-29 MED ORDER — PROMETHAZINE-CODEINE 6.25-10 MG/5ML PO SYRP: 5.0000 mL | ORAL_SOLUTION | Freq: Four times a day (QID) | ORAL | 0 refills | Status: DC | PRN

## 2021-12-29 NOTE — ED Provider Notes (Signed)
[facility] Provider Note    Event Date/Time   First MD Initiated Contact with Patient 12/29/21 1607     (approximate)   History   Rib Injury   HPI  Alice Cherry is a 61 y.o. female with history of persistent cough, breast cancer, adjustment disorder with depression and as listed in EMR presents to the emergency department for treatment and evaluation of left sided rib pain.  Patient has had a cough for over a month and about 3 days ago during a coughing spell she felt something pop in the left side of her chest.  She has had pain in that area since.  She denies having had a fever.  She is working with a pulmonologist regarding her cough and is scheduled to see her next week for some results.  She has been using her inhalers without any relief.  Also no relief with over-the-counter cough medications.      Physical Exam   Triage Vital Signs: ED Triage Vitals [12/29/21 1440]  Enc Vitals Group     BP (!) 137/110     Pulse Rate 86     Resp 18     Temp 98.4 F (36.9 C)     Temp Source Oral     SpO2 94 %     Weight 170 lb (77.1 kg)     Height 5' 8.5" (1.74 m)     Head Circumference      Peak Flow      Pain Score 8     Pain Loc      Pain Edu?      Excl. in [place]?     Most recent vital signs: Vitals:   12/29/21 1440  BP: (!) 137/110  Pulse: 86  Resp: 18  Temp: 98.4 F (36.9 C)  SpO2: 94%    General: Awake, no distress. Uncomfortable appearing. CV:  Good peripheral perfusion.  Resp:  Normal effort.  Abd:  No distention.  Other:  Focal tenderness over left lateral ribs with cough and movement.   ED Results / Procedures / Treatments   Labs (all labs ordered are listed, but only abnormal results are displayed) Labs Reviewed  RESP PANEL BY RT-PCR (FLU A&B, COVID) [id number]     EKG  Not indicated.   RADIOLOGY  Image and radiology report reviewed by me.  Images of the chest and left ribs negative for acute bony abnormality or acute  cardiopulmonary abnormality.  PROCEDURES:  Critical Care performed: No  Procedures   MEDICATIONS ORDERED IN ED: Medications - No data to display   IMPRESSION / MDM / [place] / ED COURSE   I have reviewed the triage note.  Differential diagnosis includes, but is not limited to,   Influenza, COVID, RSV, pneumonia, chronic cough  61 year old female presenting to the emergency department for treatment and evaluation of rib pain secondary to chronic cough.  See HPI for further details.  COVID and influenza testing is negative.  Chest x-ray is negative for acute concerns.  Vital signs are reassuring.  She is afebrile, not tachypneic, not tachycardic, and has a 96% oxygen saturation on room air.  Plan will be to treat with Phenergan with codeine cough medication which should also help with her pain.  [facility] called and they do not carry the Phenergan with codeine cough medication any longer.  They also do not have Tussionex.  Prescription changed just to [facility].  Patient is to follow-up with her  pulmonologist as scheduled this coming week.  Splinting with a pillow when needing to cough or sneeze advised.  She is to return to the emergency department for symptoms of change or worsen if unable to schedule an appointment.      FINAL CLINICAL IMPRESSION(S) / ED DIAGNOSES   Final diagnoses:  Rib pain on left side  Cough in adult     Rx / DC Orders   ED Discharge Orders          Ordered    promethazine-codeine (PHENERGAN WITH CODEINE) 6.25-10 MG/5ML syrup  Every 6 hours PRN        12/29/21 1637             Note:  This document was prepared using Dragon voice recognition software and may include unintentional dictation errors.   Victorino Dike, FNP 12/29/21 2203    [place], MD 12/29/21 2257

## 2021-12-29 NOTE — ED Notes (Signed)
See triage note .states she is having some pain to left rib area  thinks she may have broken some ribs

## 2021-12-29 NOTE — ED Notes (Signed)
First Nurse Note:  Pt to ED via POV stating that she thinks she broke some ribs on her left side a few days ago. Pt states that the pain is getting worse. Pt is in NAD.

## 2021-12-29 NOTE — ED Triage Notes (Signed)
Pt here with left side rib pain due to coughing x3 days. Pt in NAD in triage. Pt has not taken a covid test.

## 2021-12-29 NOTE — Progress Notes (Signed)
ASSESSMENT AND PLAN    # 61 yo female referred by Pulmonary for evaluation of a chronic non-productive cough since October. Also describes globus sensation but otherwise only very occasional typical GERD symptoms such as pyrosis or regurgitation.  Unrevealing ENT evaluation in December. No improvement in symptoms on PPI for at least two months. Symptoms could be GERD related, not clear at this point.   --Will schedule for EGD to look for overt evidence of GERD. The risks and benefits of EGD with possible biopsies were discussed with the patient who agrees to proceed. Patient and husband asking for soonest possible appointment for EGD as cough is causing a lot of misery for her.  --She has a follow up with Pulmonary next week to get results of PFTs.   # Colon cancer screening. Reports normal colonoscopy  4 years ago at [facility]. Will try to obtain reports and then put her on our colonoscopy recall list   HISTORY OF PRESENT ILLNESS     Chief Complaint : cough, feels like cotton in throat at time, sometimes hard to initiate a swallow.   Alice Cherry is a 61 y.o. female with a past medical history significant for left breast cancer s/p mastectomy, osteopenia, allergic rhinitis, COVID19.  See PMH below for any additional history.   Referred by Pulmonologist, Dr. Loanne Drilling, for evaluation of a cough. Since October Talin has had a daily,  nonproductive cough.  Cough occurs throughout the day, it exhausts her. Eating can cause her to cough. The vigorous cough has led to ribcage pain / ? Injury. She does take cough medication during day and Nyquil at night so she can rest without coughing. She does not recall making any medication /environmental exposure  changes or other changes prior to onset of symptoms.She also describes globus sensation with feeling like there is a cotton ball in her throat.  She is frequently hoarse.  She does have a dry throat/mouth despite drinking a lot of water. The coughing is so  vigorous at times that it leads to vomiting which in turn can cause heartburn.  She rarely regurgitates acid into her throat.  She does have sinus drainage but does not feel it contributes to the cough.  She sometimes finds it difficult to initiate swallowing process but has no dysphagia.  No odynophagia.. Sometimes eating causes her to cough.   She has been on a PPI for at least 2 months without any improvement in symptoms.  The only thing that has really helped the cough has been prednisone.    Her weight has been stable.   May saw ENT with [facility] in December.  Flexible laryngoscopy -mild erythema of the arytenoids, otherwise unremarkable exam.  She is still undergoing work-up with pulmonary.  Last seen by pulmonary on 12/06/2021, PFTs were ordered, Symbicort increased and potential causes of cough discussed such as undiagnosed obstructive lung disease, GERD, etc.  She is supposed to get  results of PFTs next week.  CXR suggests possible COPD  Data Reviewed:  CBC Latest Ref Rng & Units 09/18/2021 03/09/2021  WBC 4.0 - 10.5 K/uL 5.8 5.4  Hemoglobin 12.0 - 15.0 g/dL 12.1 13.1  Hematocrit 36.0 - 46.0 % 37.0 39.4  Platelets 150.0 - 400.0 K/uL 238.0 240.0     CMP Latest Ref Rng & Units 09/18/2021 03/09/2021  Glucose 70 - 99 mg/dL 93 87  BUN 6 - 23 mg/dL 21 20  Creatinine 0.40 - 1.20 mg/dL 0.76 0.77  Sodium 135 -  145 mEq/L 137 137  Potassium 3.5 - 5.1 mEq/L 4.1 4.5  Chloride 96 - 112 mEq/L 101 100  CO2 19 - 32 mEq/L 28 28  Calcium 8.4 - 10.5 mg/dL 9.3 9.8  Total Protein 6.0 - 8.3 g/dL - 7.6  Total Bilirubin 0.2 - 1.2 mg/dL - 0.6  Alkaline Phos 39 - 117 U/L - 52  AST 0 - 37 U/L - 26  ALT 0 - 35 U/L - 20    DG Chest 2 View CLINICAL DATA:  Distant cough for 6 months worse in past 2 months, history of breast cancer post LEFT mastectomy  EXAM: CHEST - 2 VIEW  COMPARISON:  None  FINDINGS: Normal heart size, mediastinal contours, and pulmonary vascularity.  Hyperinflation with  mild bronchitic changes question COPD.  No pulmonary infiltrate, pleural effusion, or pneumothorax.  Minimal RIGHT apical scarring.  Diffuse osseous demineralization.  Prior LEFT mastectomy.  IMPRESSION: Question COPD changes.  No acute abnormalities.  Electronically Signed   By: Lavonia Dana M.D.   On: 09/18/2021 11:16   PREVIOUS GI EVALUATIONS:   Colonoscopies with [facility], last one ~ 4 years ago and no polyps per patient   Past Medical History:  Diagnosis Date   Breast cancer ([place])    left side     Past Surgical History:  Procedure Laterality Date   ABDOMINAL HYSTERECTOMY  1995   APPENDECTOMY     MASTECTOMY     Family History  Problem Relation Age of Onset   Diabetes Mother    Alzheimer's disease Mother    Skin cancer Father    Cancer Father        multiple myeloma   Social History   Tobacco Use   Smoking status: Never   Smokeless tobacco: Never  Substance Use Topics   Alcohol use: Not Currently   Drug use: Not Currently   Current Outpatient Medications  Medication Sig Dispense Refill   cetirizine (ZYRTEC) 10 MG tablet TAKE 1 TABLET (10 MG TOTAL) BY MOUTH DAILY. FOR DRAINAGE AND COUGH 90 tablet 0   fluticasone (FLONASE) 50 MCG/ACT nasal spray Place 2 sprays into both nostrils daily. 16 g 0   meloxicam (MOBIC) 15 MG tablet Take 15 mg by mouth daily. PRN for Knee pain     omeprazole (PRILOSEC) 40 MG capsule TAKE 1 CAPSULE (40 MG TOTAL) BY MOUTH DAILY. FOR COUGH 90 capsule 0   No current facility-administered medications for this visit.   Allergies  Allergen Reactions   Penicillins      Review of Systems: Positive for fatigue, right and left rib cage pain, sore throat.  All other systems reviewed and negative except where noted in HPI.    PHYSICAL EXAM :    Wt Readings from Last 3 Encounters:  12/29/21 175 lb (79.4 kg)  12/13/21 170 lb (77.1 kg)  12/06/21 169 lb (76.7 kg)    Ht 5' 8.5" (1.74 m)    Wt 175 lb (79.4 kg)    BMI 26.22 kg/m   Constitutional:  Generally well appearing female in no acute distress. Psychiatric: Pleasant. Normal mood and affect. Behavior is normal. EENT: Pupils normal.  Conjunctivae are normal. No scleral icterus. Neck supple.  Cardiovascular: Normal rate, regular rhythm. No edema Pulmonary/chest: Effort normal and breath sounds normal. No wheezing, rales or rhonchi. Abdominal: Soft, nondistended, nontender. Bowel sounds active throughout. There are no masses palpable. No hepatomegaly. Neurological: Alert and oriented to person place and time. Skin: Skin is warm and dry. No rashes  noted.  Tye Savoy, NP  12/29/2021, 8:29 AM  Cc:  Referring Provider Chi Rodman Pickle, MD

## 2021-12-29 NOTE — Progress Notes (Signed)
I agree with the above note, plan. The big question we are trying to answer is "does she have signficant GERD" and then also "if she does, is it causing her chronic cough".  I think she would be well served to have EGD with ability to place a bravo pH probe at the same time if the EGD shows no macrosopic evidence of GERD. Those [facility] apts are limited to certain times I believe and so may not be able to place a bravo at her current [place] apt date, I"m not sure.

## 2021-12-29 NOTE — Patient Instructions (Signed)
If you are age 61 or older, your body mass index should be between 23-30. Your Body mass index is 26.22 kg/m. If this is out of the aforementioned range listed, please consider follow up with your Primary Care Provider.  If you are age 5 or younger, your body mass index should be between 19-25. Your Body mass index is 26.22 kg/m. If this is out of the aformentioned range listed, please consider follow up with your Primary Care Provider.   You have been scheduled for an endoscopy. Please follow written instructions given to you at your visit today. If you use inhalers (even only as needed), please bring them with you on the day of your procedure.  The [facility] providers would like to encourage you to use [facility] to communicate with providers for non-urgent requests or questions.  Due to long hold times on the telephone, sending your provider a message by [facility] [address] may be a faster and more efficient way to get a response.  Please allow 48 business hours for a response.  Please remember that this is for non-urgent requests.   It was a pleasure to see you today!  Thank you for trusting me with your gastrointestinal care!    Tye Savoy, NP

## 2022-01-01 ENCOUNTER — Encounter: Payer: Self-pay | Admitting: Pulmonary Disease

## 2022-01-01 ENCOUNTER — Ambulatory Visit (INDEPENDENT_AMBULATORY_CARE_PROVIDER_SITE_OTHER): Payer: 59 | Admitting: Pulmonary Disease

## 2022-01-01 ENCOUNTER — Other Ambulatory Visit (HOSPITAL_COMMUNITY): Payer: Self-pay

## 2022-01-01 ENCOUNTER — Other Ambulatory Visit: Payer: Self-pay

## 2022-01-01 VITALS — BP 122/70 | HR 79 | Temp 98.1°F | Ht 68.5 in | Wt 170.0 lb

## 2022-01-01 DIAGNOSIS — R942 Abnormal results of pulmonary function studies: Secondary | ICD-10-CM | POA: Insufficient documentation

## 2022-01-01 DIAGNOSIS — R053 Chronic cough: Secondary | ICD-10-CM | POA: Diagnosis not present

## 2022-01-01 MED ORDER — HYDROCODONE BIT-HOMATROP MBR 5-1.5 MG/5ML PO SOLN
5.0000 mL | Freq: Four times a day (QID) | ORAL | 0 refills | Status: DC | PRN
  Filled 2022-01-01: qty 120, 10d supply, fill #0

## 2022-01-01 NOTE — Progress Notes (Signed)
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  Synopsis: She has had chronic cough that began in October 2022 (four months) that has progressively worsened. Cough is gagging and occurs daily. Exertion will worsen it including walking upstairs. Cold air has worsens it. Will also be aggravated while eating. Denies heartburn and chest pressure. Has post-tussive emesis. This interrupts her when she is talking at work. 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 The cough is episodic 3-4 times a day and wont stop for up to a minute. Sometimes she feels like she can't breathe and wheezing. Will normalize after a few days but she feels exhausted. Unable to sing a full song. Laying down improves her symptoms. Does not awaken her but has started coughing at night. Hx childhood chronic bronchitis requiring multiple antibiotic courses. Started from 72 to 89 years old. Less symptoms after reaching her 71s.  Seen by PCP and has been using Symbicort for two weeks and does not feel like it is effective. Prednisone course completely resolved her symptoms. She is on omeprazole 40 mg and has decreased her caffeine intake with no change. She uses flonase 1 spray per nare once a day. She has been seen by ENT 10/19/21 with normal exam.   01/01/22 She was only able to take symbicort for three days but had to discontinue due to nausea. She was seen by GI on 12/29/2021.  Her symptoms have not been improved on PPI for 2 months.  She is scheduled for an EGD. She was also seen the ED on 12/29/2021 for left-sided rib injury in setting of chronic cough.  Chest x-ray negative for any  acute issues.  O2 saturations were 96%.  She was ordered codeine cough medication however pharmacy did not carry it so prescription was changed to [facility] 03/21/2024. No improvement in cough with this med. Denies shortness of breath or wheezing.  Social History: Educational administration Frequent childhood exacerbations Mother - asthma Son passed away 2 years ago Development worker, international aid  Past Medical History:  Diagnosis Date   Breast cancer ([place])    left side     Family History  Problem Relation Age of Onset   Diabetes Mother    Alzheimer's disease Mother    Skin cancer Father    Cancer Father        multiple myeloma     Social History   Occupational History   Not on file  Tobacco Use   Smoking status: Never   Smokeless tobacco: Never  Vaping Use   Vaping Use: Never used  Substance and Sexual Activity   Alcohol use: Not Currently   Drug use: Not Currently   Sexual activity: Not on file    Allergies  Allergen Reactions   Penicillins      Outpatient Medications Prior to Visit  Medication Sig Dispense Refill   cetirizine (ZYRTEC) 10 MG tablet TAKE 1 TABLET (10 MG TOTAL) BY MOUTH DAILY. FOR DRAINAGE AND COUGH 90 tablet 0   fluticasone (FLONASE) 50 MCG/ACT nasal spray Place 2 sprays into both nostrils daily. 16 g 0   meloxicam (MOBIC) 15  MG tablet Take 15 mg by mouth daily. PRN for Knee pain     omeprazole (PRILOSEC) 40 MG capsule TAKE 1 CAPSULE (40 MG TOTAL) BY MOUTH DAILY. FOR COUGH 90 capsule 0   oxyCODONE-acetaminophen (PERCOCET) 5-325 MG tablet Take 1 tablet by mouth every 6 (six) hours as needed for up to 5 days for severe pain. 20 tablet 0   No facility-administered medications prior to visit.    Review of Systems  Constitutional:  Negative for chills, diaphoresis, fever, malaise/fatigue and weight loss.  HENT:  Negative for congestion.   Respiratory:  Positive for cough. Negative for hemoptysis, sputum production, shortness of breath and wheezing.   Cardiovascular:   Negative for chest pain, palpitations and leg swelling.    Objective:   Vitals:   01/01/22 0853  BP: 122/70  Pulse: 79  Temp: 98.1 F (36.7 C)  TempSrc: Oral  SpO2: 98%  Weight: 170 lb (77.1 kg)  Height: 5' 8.5" (1.74 m)   SpO2: 98 % O2 Device: None (Room air)  Physical Exam: General: Well-appearing, no acute distress HENT: [place], AT Eyes: EOMI, no scleral icterus Respiratory: Clear to auscultation bilaterally.  No crackles, wheezing or rales Cardiovascular: RRR, -M/R/G, no JVD Extremities:-Edema,-tenderness Neuro: AAO x4, CNII-XII grossly intact Psych: Normal mood, normal affect  Data Reviewed:  Imaging: CXR 09/18/21 - No infiltrate effusion or edema. Mild bronchitic changes. Minimal right apical scarring CXR 12/29/2021-hyperinflation.  No evidence of infiltrate, effusion or edema.  No rib fractures.  PFT: 12/27/2021 FVC 3.27 (84%) FEV1 2.82 (94%) ratio 79 TLC 89% DLCO 71% Interpretation: No obstructive or restrictive defect.  No significant bronchodilator response however does not preclude clinical benefit if perceived.  Mildly reduced gas exchange.  Recommend clinical correlation.  Labs: CBC    Component Value Date/Time   WBC 5.8 09/18/2021 1044   RBC 4.35 09/18/2021 1044   HGB 12.1 09/18/2021 1044   HCT 37.0 09/18/2021 1044   PLT 238.0 09/18/2021 1044   MCV 85.1 09/18/2021 1044   MCHC 32.6 09/18/2021 1044   RDW 14.3 09/18/2021 1044   LYMPHSABS 1.5 09/18/2021 1044   MONOABS 0.4 09/18/2021 1044   EOSABS 0.1 09/18/2021 1044   BASOSABS 0.0 09/18/2021 1044   Absolute eos 09/18/21 - 100     Assessment & Plan:   Discussion: 61 year old female never smoker with childhood bronchitis, history of breast cancer s/p left mastectomy and allergic rhinitis who presents for follow-up for cough.  Trial of ICS/LABA was unsuccessful due to intolerance PFTs reviewed and normal except for mildly reduced DLCO.  This could represent pulmonary vascular process or early ILD, though  my suspicion of either diagnosis is low at this point.  Will evaluate with echocardiogram and high-resolution CT. If both these tests are negative, no further pulmonary work-up indicated. Will need follow-up PFT in one year  Chronic cough Reduced DLCO --ORDER HRCT --ORDER complete echocardiogram  Chronic cough --STOP bronchodilators --ORDER hycodon as needed for cough  Health Maintenance Immunization History  Administered Date(s) Administered   Influenza,inj,Quad PF,6+ Mos 08/01/2016, 09/19/2017, 07/28/2018, 09/15/2021   Influenza-Unspecified 08/22/2020   PFIZER(Purple Top)SARS-COV-2 Vaccination 01/19/2020, 02/16/2020, 10/14/2020   Pfizer Covid-19 Vaccine Bivalent Booster 30yr & up 09/15/2021   Tdap 04/20/2011, 03/09/2021   Zoster Recombinat (Shingrix) 09/12/2019, 12/12/2019   CT Lung Screen - not qualified. Never smoker  No orders of the defined types were placed in this encounter.  Meds ordered this encounter  Medications   HYDROcodone bit-homatropine (HYCODAN) 5-1.5 MG/5ML syrup    Sig:  Take 5 mLs by mouth every 6 (six) hours as needed for cough.    Dispense:  473 mL    Refill:  0    Return in about 3 weeks (around 01/22/2022).   I have spent a total time of 35-minutes on the day of the appointment reviewing prior documentation, coordinating care and discussing medical diagnosis and plan with the patient/family. Past medical history, allergies, medications were reviewed. Pertinent imaging, labs and tests included in this note have been reviewed and interpreted independently by me.   Kaycee, MD [facility] 01/01/2022 9:22 AM  Office Number [phone]

## 2022-01-01 NOTE — Patient Instructions (Signed)
Chronic cough Reduced DLCO --ORDER HRCT --ORDER complete echocardiogram  Chronic cough --STOP bronchodilators --ORDER hycodon as needed for cough  Follow-up in 3 weeks (March 6-10)

## 2022-01-03 ENCOUNTER — Other Ambulatory Visit: Payer: Self-pay

## 2022-01-03 ENCOUNTER — Ambulatory Visit (AMBULATORY_SURGERY_CENTER): Payer: 59 | Admitting: Gastroenterology

## 2022-01-03 ENCOUNTER — Encounter: Payer: Self-pay | Admitting: Gastroenterology

## 2022-01-03 VITALS — BP 123/60 | HR 65 | Temp 97.5°F | Resp 14 | Ht 68.0 in | Wt 175.0 lb

## 2022-01-03 DIAGNOSIS — R0989 Other specified symptoms and signs involving the circulatory and respiratory systems: Secondary | ICD-10-CM

## 2022-01-03 DIAGNOSIS — K449 Diaphragmatic hernia without obstruction or gangrene: Secondary | ICD-10-CM | POA: Diagnosis not present

## 2022-01-03 DIAGNOSIS — R059 Cough, unspecified: Secondary | ICD-10-CM

## 2022-01-03 MED ORDER — SODIUM CHLORIDE 0.9 % IV SOLN: 500.0000 mL | Freq: Once | INTRAVENOUS | Status: DC

## 2022-01-03 NOTE — Progress Notes (Signed)
Report to PACU, RN, vss, BBS= Clear.  

## 2022-01-03 NOTE — Progress Notes (Signed)
°  The recent H&P (dated 12/29/2021) was reviewed, the patient was examined and there is no change in the patients condition since that H&P was completed.   Alice Cherry  01/03/2022, 1:11 PM

## 2022-01-03 NOTE — Patient Instructions (Signed)
HANDOUTS PROVIDED ON: HIATAL HERNIA  You may resume your previous diet and medication schedule.  Over the next 10 days you can taper off of the antacid medications.  Thank you for allowing [place] to care for you today!!!   YOU HAD AN ENDOSCOPIC PROCEDURE TODAY AT [facility]:   Refer to the procedure report that was given to you for any specific questions about what was found during the examination.  If the procedure report does not answer your questions, please call your gastroenterologist to clarify.  If you requested that your care partner not be given the details of your procedure findings, then the procedure report has been included in a sealed envelope for you to review at your convenience later.  YOU SHOULD EXPECT: Some feelings of bloating in the abdomen. Passage of more gas than usual.  Walking can help get rid of the air that was put into your GI tract during the procedure and reduce the bloating.   Please Note:  You might notice some irritation and congestion in your nose or some drainage.  This is from the oxygen used during your procedure.  There is no need for concern and it should clear up in a day or so.  SYMPTOMS TO REPORT IMMEDIATELY:  Following upper endoscopy (EGD)  Vomiting of blood or coffee ground material  New chest pain or pain under the shoulder blades  Painful or persistently difficult swallowing  New shortness of breath  Fever of 100F or higher  Black, tarry-looking stools  For urgent or emergent issues, a gastroenterologist can be reached at any hour by calling [phone]. Do not use MyChart messaging for urgent concerns.    DIET:  We do recommend a small meal at first, but then you may proceed to your regular diet.  Drink plenty of fluids but you should avoid alcoholic beverages for 24 hours.  ACTIVITY:  You should plan to take it easy for the rest of today and you should NOT DRIVE or use heavy machinery until tomorrow (because of the  sedation medicines used during the test).    FOLLOW UP: Our staff will call the number listed on your records Friday morning between 7:15 am and 8:15 am following your procedure to check on you and address any questions or concerns that you may have regarding the information given to you following your procedure. If we do not reach you, we will leave a message.  We will attempt to reach you two times.  During this call, we will ask if you have developed any symptoms of COVID 19. If you develop any symptoms (ie: fever, flu-like symptoms, shortness of breath, cough etc.) before then, please call [phone].  If you test positive for Covid 19 in the 2 weeks post procedure, please call and report this information to [place].    If any biopsies were taken you will be contacted by phone or by letter within the next 1-3 weeks.  Please call us at [phone] if you have not heard about the biopsies in 3 weeks.    SIGNATURES/CONFIDENTIALITY: You and/or your care partner have signed paperwork which will be entered into your electronic medical record.  These signatures attest to the fact that that the information above on your After Visit Summary has been reviewed and is understood.  Full responsibility of the confidentiality of this discharge information lies with you and/or your care-partner.

## 2022-01-03 NOTE — Op Note (Signed)
[facility] Patient Name: Alice Cherry Procedure Date: 01/03/2022 1:41 PM MRN: [id number] Endoscopist: Milus Banister , MD Age: 61 Referring MD:  Date of Birth: [birth year] Gender: Female Account #: [id number] Procedure:                Upper GI endoscopy Indications:              [place] cough for 4 months, no improvement on BID                            PPI, currently on once daily PPI Medicines:                Monitored Anesthesia Care Procedure:                Pre-Anesthesia Assessment:                           - Prior to the procedure, a History and Physical                            was performed, and patient medications and                            allergies were reviewed. The patient's tolerance of                            previous anesthesia was also reviewed. The risks                            and benefits of the procedure and the sedation                            options and risks were discussed with the patient.                            All questions were answered, and informed consent                            was obtained. Prior Anticoagulants: The patient has                            taken no previous anticoagulant or antiplatelet                            agents. ASA Grade Assessment: II - A patient with                            mild systemic disease. After reviewing the risks                            and benefits, the patient was deemed in                            satisfactory condition to undergo the procedure.  After obtaining informed consent, the endoscope was                            passed under direct vision. Throughout the                            procedure, the patient's blood pressure, pulse, and                            oxygen saturations were monitored continuously. The                            GIF [id number] #[id number] was introduced through the                            mouth, and advanced to the  second part of duodenum.                            The upper GI endoscopy was accomplished without                            difficulty. The patient tolerated the procedure                            well. Scope In: Scope Out: Findings:                 A small, 1 cm hiatal hernia, Hill Grade III.                           The exam was otherwise without abnormality. Complications:            No immediate complications. Estimated blood loss:                            None. Estimated Blood Loss:     Estimated blood loss: none. Impression:               - Small hiatal hernia.                           - The examination was otherwise normal.                           - Given zero improvement on once daily or twice                            daily PPI it is very unlikely that ACID is playing                            a role here. Recommendation:           - Patient has a contact number available for                            emergencies. The signs and symptoms of potential  delayed complications were discussed with the                            patient. Return to normal activities tomorrow.                            Written discharge instructions were provided to the                            patient.                           - Resume previous diet.                           - Continue present medications. Since you've had no                            improvement on antiacid medicines you can taper off                            completely over the next 10 days.                           - Dr. Ardis Hughs' office will arrange barium esophagram                            to check for reflux.                           - Continue pulmonary workup, consider returning to                            the ENT as well for now. Milus Banister, MD 01/03/2022 1:56:28 PM This report has been signed electronically.

## 2022-01-03 NOTE — Progress Notes (Signed)
VS-CW 

## 2022-01-04 ENCOUNTER — Other Ambulatory Visit: Payer: Self-pay

## 2022-01-04 DIAGNOSIS — R059 Cough, unspecified: Secondary | ICD-10-CM

## 2022-01-05 ENCOUNTER — Telehealth: Payer: Self-pay | Admitting: *Deleted

## 2022-01-05 NOTE — Telephone Encounter (Signed)
°  Follow up Call-  Call back number 01/03/2022  Post procedure Call Back phone  # [phone]  Permission to leave phone message Yes  Some recent data might be hidden     Patient questions:  Do you have a fever, pain , or abdominal swelling? No. Pain Score  0 *  Have you tolerated food without any problems? Yes.    Have you been able to return to your normal activities? Yes.    Do you have any questions about your discharge instructions: Diet   No. Medications  No. Follow up visit  No.  Do you have questions or concerns about your Care? No.  Actions: * If pain score is 4 or above: No action needed, pain <4.  Have you developed a fever since your procedure? no  2.   Have you had an respiratory symptoms (SOB or cough) since your procedure? no  3.   Have you tested positive for COVID 19 since your procedure no  4.   Have you had any family members/close contacts diagnosed with the COVID 19 since your procedure?  no   If yes to any of these questions please route to Joylene John, RN and Joella Prince, RN

## 2022-01-11 ENCOUNTER — Other Ambulatory Visit: Payer: Self-pay

## 2022-01-11 ENCOUNTER — Ambulatory Visit
Admission: RE | Admit: 2022-01-11 | Discharge: 2022-01-11 | Disposition: A | Discharge status: Home / Self Care | Payer: 59 | Source: Ambulatory Visit | Attending: Pulmonary Disease | Admitting: Pulmonary Disease

## 2022-01-11 DIAGNOSIS — I371 Nonrheumatic pulmonary valve insufficiency: Secondary | ICD-10-CM | POA: Diagnosis not present

## 2022-01-11 DIAGNOSIS — I071 Rheumatic tricuspid insufficiency: Secondary | ICD-10-CM | POA: Diagnosis not present

## 2022-01-11 DIAGNOSIS — R942 Abnormal results of pulmonary function studies: Secondary | ICD-10-CM

## 2022-01-11 DIAGNOSIS — I340 Nonrheumatic mitral (valve) insufficiency: Secondary | ICD-10-CM | POA: Diagnosis not present

## 2022-01-11 DIAGNOSIS — Z853 Personal history of malignant neoplasm of breast: Secondary | ICD-10-CM | POA: Insufficient documentation

## 2022-01-11 DIAGNOSIS — I361 Nonrheumatic tricuspid (valve) insufficiency: Secondary | ICD-10-CM

## 2022-01-11 LAB — ECHOCARDIOGRAM COMPLETE
AR max vel: 2.07 cm2
AV Area VTI: 2.19 cm2
AV Area mean vel: 1.86 cm2
AV Mean grad: 3 mmHg
AV Peak grad: 5 mmHg
Ao pk vel: 1.12 m/s
Area-P 1/2: 3.76 cm2
MV VTI: 2.19 cm2
S' Lateral: 1.99 cm

## 2022-01-11 NOTE — Progress Notes (Signed)
*  PRELIMINARY RESULTS* Echocardiogram 2D Echocardiogram has been performed.  Sherrie Sport 01/11/2022, 10:59 AM

## 2022-01-12 ENCOUNTER — Ambulatory Visit
Admission: RE | Admit: 2022-01-12 | Discharge: 2022-01-12 | Disposition: A | Discharge status: Home / Self Care | Payer: 59 | Source: Ambulatory Visit | Attending: Gastroenterology | Admitting: Gastroenterology

## 2022-01-12 DIAGNOSIS — R059 Cough, unspecified: Secondary | ICD-10-CM | POA: Insufficient documentation

## 2022-01-17 ENCOUNTER — Encounter: Payer: Self-pay | Admitting: Nurse Practitioner

## 2022-01-17 ENCOUNTER — Ambulatory Visit
Admission: RE | Admit: 2022-01-17 | Discharge: 2022-01-17 | Disposition: A | Discharge status: Home / Self Care | Payer: 59 | Source: Ambulatory Visit | Attending: Pulmonary Disease | Admitting: Pulmonary Disease

## 2022-01-17 ENCOUNTER — Other Ambulatory Visit: Payer: Self-pay

## 2022-01-17 ENCOUNTER — Ambulatory Visit (INDEPENDENT_AMBULATORY_CARE_PROVIDER_SITE_OTHER): Payer: 59 | Admitting: Nurse Practitioner

## 2022-01-17 VITALS — BP 128/78 | HR 73 | Temp 97.1°F | Resp 12 | Ht 68.0 in | Wt 171.0 lb

## 2022-01-17 DIAGNOSIS — R053 Chronic cough: Secondary | ICD-10-CM

## 2022-01-17 DIAGNOSIS — R942 Abnormal results of pulmonary function studies: Secondary | ICD-10-CM | POA: Diagnosis not present

## 2022-01-17 MED ORDER — PREDNISONE 20 MG PO TABS: 40.0000 mg | ORAL_TABLET | Freq: Every day | ORAL | 0 refills | Status: AC

## 2022-01-17 MED ORDER — BENZONATATE 200 MG PO CAPS: 200.0000 mg | ORAL_CAPSULE | Freq: Two times a day (BID) | ORAL | 0 refills | Status: DC | PRN

## 2022-01-17 NOTE — Assessment & Plan Note (Signed)
Patient is concerned because now she is having posttussive emesis is affecting her life and her job performance.  Patient has been evaluated by ENT, GI, cardiology, pulmonology.  Currently pending HRCT that was performed today.  Patient has tried PPI, antihistamine, topical steroid no spray, SABA, ICS, DayQuil without great relief.  Describes Hycodan cough syrup nightly by pulmonology that helps her rest.  She also had prednisone in the past that almost completely resolved her symptoms.  Patient is accompanied by husband and wanted further advice.  We will try her on Tessalon Perles through the day sent message to pulmonologist deferring refill Hycodan to them and do a short course of prednisone to see if we can abate her symptoms. ?

## 2022-01-17 NOTE — Progress Notes (Signed)
? ?Acute Office Visit ? ?Subjective:  ? ? Patient ID: Alice Cherry, female    DOB: 1961-01-17, 61 y.o.   MRN: [id number] ? ?Chief Complaint  ?Patient presents with  ? Cough  ?  Continues to cough, having vomiting episodes now-this has developed the last 6 days due to having episodes of feeling like her throat is closing up and its causing her to vomit, not eating as good because food can cause her to cough.  ? ? ? ?Patient is in today for cough ? ?She has been evaluated by her PCP on 09/15/2021, 09/28/2021, and 12/13/2021. Evaluated by me on 10/18/2021 and 11/10/2021. Patient was evaluated by ENT on 10/19/2021. Evaluated by pulmonology on 12/06/2021, 01/01/2022 and had a HRCT performed today. ?Has been evaluated by GI on 12/29/2021 with an EGD performed. Patient also states that she underwent a swallow evaluation  ? ?States that since the mid January it seems to have increased in severity  ?Patient is currently taking a PPI, antihistamine, topical steroid, hycodan QHS and Dyquill through the day ? ?She has tried prednisone in the past with great symptom relief. She has also been on SABA and ICS without benefit.  ? ?States that she has had post tussive emesis daily for hte past 5 days and twice a day for an additional day. This is effecting her work and her ability to eat.  ? ? ?Past Medical History:  ?Diagnosis Date  ? Breast cancer ([place])   ? left side  ? ? ?Past Surgical History:  ?Procedure Laterality Date  ? ABDOMINAL HYSTERECTOMY  1995  ? APPENDECTOMY    ? MASTECTOMY    ? ROTATOR CUFF REPAIR Right   ? ? ?Family History  ?Problem Relation Age of Onset  ? Diabetes Mother   ? Alzheimer's disease Mother   ? Skin cancer Father   ? Cancer Father   ?     multiple myeloma  ? Colon cancer Neg Hx   ? Esophageal cancer Neg Hx   ? Stomach cancer Neg Hx   ? Rectal cancer Neg Hx   ? ? ?Social History  ? ?Socioeconomic History  ? Marital status: Married  ?  Spouse name: Not on file  ? Number of children: Not on file  ? Years of  education: Not on file  ? Highest education level: Not on file  ?Occupational History  ? Not on file  ?Tobacco Use  ? Smoking status: Never  ? Smokeless tobacco: Never  ?Vaping Use  ? Vaping Use: Never used  ?Substance and Sexual Activity  ? Alcohol use: Not Currently  ? Drug use: Not Currently  ? Sexual activity: Not on file  ?Other Topics Concern  ? Not on file  ?Social History Narrative  ? Not on file  ? ?Social Determinants of Health  ? ?Financial Resource Strain: Not on file  ?Food Insecurity: Not on file  ?Transportation Needs: Not on file  ?Physical Activity: Not on file  ?Stress: Not on file  ?Social Connections: Not on file  ?Intimate Partner Violence: Not on file  ? ? ?Outpatient Medications Prior to Visit  ?Medication Sig Dispense Refill  ? cetirizine (ZYRTEC) 10 MG tablet TAKE 1 TABLET (10 MG TOTAL) BY MOUTH DAILY. FOR DRAINAGE AND COUGH 90 tablet 0  ? fluticasone (FLONASE) 50 MCG/ACT nasal spray Place 2 sprays into both nostrils daily. 16 g 0  ? HYDROcodone bit-homatropine (HYCODAN) 5-1.5 MG/5ML syrup Take 5 mLs by mouth every 6 (six) hours as needed  for cough. 473 mL 0  ? meloxicam (MOBIC) 15 MG tablet Take 15 mg by mouth daily. PRN for Knee pain    ? omeprazole (PRILOSEC) 40 MG capsule TAKE 1 CAPSULE (40 MG TOTAL) BY MOUTH DAILY. FOR COUGH 90 capsule 0  ? ?No facility-administered medications prior to visit.  ? ? ?Allergies  ?Allergen Reactions  ? Penicillins   ? ? ?Review of Systems  ?Constitutional:  Negative for chills and fever.  ?HENT:  Positive for postnasal drip. Negative for congestion, ear discharge, ear pain, sinus pressure, sinus pain and sore throat (from coughing and vomiting).   ?Respiratory:  Positive for cough and shortness of breath.   ?Cardiovascular:  Positive for chest pain (soreness from coughing).  ?Gastrointestinal:  Positive for vomiting (post tussive). Negative for nausea.  ?Reach out to Dr. Loanne Drilling (pulm) ?   ?Objective:  ?  ?Physical Exam ?Vitals and nursing note reviewed.   ?Constitutional:   ?   Appearance: Normal appearance.  ?HENT:  ?   Right Ear: Tympanic membrane, ear canal and external ear normal.  ?   Left Ear: Tympanic membrane, ear canal and external ear normal.  ?   Mouth/Throat:  ?   Mouth: Mucous membranes are moist.  ?   Pharynx: Oropharynx is clear.  ?Neck:  ?   Thyroid: No thyroid mass, thyromegaly or thyroid tenderness.  ?Cardiovascular:  ?   Rate and Rhythm: Normal rate and regular rhythm.  ?   Heart sounds: Normal heart sounds.  ?Pulmonary:  ?   Effort: Pulmonary effort is normal.  ?   Breath sounds: Normal breath sounds.  ?Abdominal:  ?   General: Bowel sounds are normal.  ?Lymphadenopathy:  ?   Cervical: No cervical adenopathy.  ?Neurological:  ?   Mental Status: She is alert.  ? ? ?BP 128/78   Pulse 73   Temp (!) 97.1 ?F (36.2 ?C)   Resp 12   Ht _0  (1.727 m)   Wt 171 lb (77.6 kg)   SpO2 99%   BMI 26.00 kg/m?  ?Wt Readings from Last 3 Encounters:  ?01/17/22 171 lb (77.6 kg)  ?01/03/22 175 lb (79.4 kg)  ?01/01/22 170 lb (77.1 kg)  ? ? ?Health Maintenance Due  ?Topic Date Due  ? HIV Screening  Never done  ? ? ?There are no preventive care reminders to display for this patient. ? ? ?No results found for: TSH ?Lab Results  ?Component Value Date  ? WBC 5.8 09/18/2021  ? HGB 12.1 09/18/2021  ? HCT 37.0 09/18/2021  ? MCV 85.1 09/18/2021  ? PLT 238.0 09/18/2021  ? ?Lab Results  ?Component Value Date  ? NA 137 09/18/2021  ? K 4.1 09/18/2021  ? CO2 28 09/18/2021  ? GLUCOSE 93 09/18/2021  ? BUN 21 09/18/2021  ? CREATININE 0.76 09/18/2021  ? BILITOT 0.6 03/09/2021  ? ALKPHOS 52 03/09/2021  ? AST 26 03/09/2021  ? ALT 20 03/09/2021  ? PROT 7.6 03/09/2021  ? ALBUMIN 4.6 03/09/2021  ? CALCIUM 9.3 09/18/2021  ? GFR 85.06 09/18/2021  ? ?Lab Results  ?Component Value Date  ? CHOL 210 (H) 03/09/2021  ? ?Lab Results  ?Component Value Date  ? HDL 60.80 03/09/2021  ? ?Lab Results  ?Component Value Date  ? LDLCALC 136 (H) 03/09/2021  ? ?Lab Results  ?Component Value Date  ? TRIG  65.0 03/09/2021  ? ?Lab Results  ?Component Value Date  ? CHOLHDL 3 03/09/2021  ? ?No results found for: HGBA1C ? ?   ?  Assessment & Plan:  ? ?Problem List Items Addressed This Visit   ? ?  ? Other  ? Chronic cough - Primary  ? Relevant Medications  ? predniSONE (DELTASONE) 20 MG tablet  ? benzonatate (TESSALON) 200 MG capsule  ? ? ? ?Meds ordered this encounter  ?Medications  ? predniSONE (DELTASONE) 20 MG tablet  ?  Sig: Take 2 tablets (40 mg total) by mouth daily with breakfast for 5 days.  ?  Dispense:  10 tablet  ?  Refill:  0  ?  Order Specific Question:   Supervising Provider  ?  Answer:   Loura Pardon A [[id number]]  ? benzonatate (TESSALON) 200 MG capsule  ?  Sig: Take 1 capsule (200 mg total) by mouth 2 (two) times daily as needed for cough.  ?  Dispense:  20 capsule  ?  Refill:  0  ?  Order Specific Question:   Supervising Provider  ?  Answer:   Loura Pardon A [[id number]]  ? ?This visit occurred during the SARS-CoV-2 public health emergency.  Safety protocols were in place, including screening questions prior to the visit, additional usage of staff PPE, and extensive cleaning of exam room while observing appropriate contact time as indicated for disinfecting solutions.  ? ? ?Romilda Garret, NP ? ?

## 2022-01-17 NOTE — Patient Instructions (Signed)
Nice to see you today ?I will reach out to Dr. Loanne Drilling and get back in touch with you ?I sent in the cough pills and prednisone.  ? ?

## 2022-01-23 ENCOUNTER — Other Ambulatory Visit: Payer: Self-pay

## 2022-01-23 ENCOUNTER — Encounter: Payer: Self-pay | Admitting: Pulmonary Disease

## 2022-01-23 ENCOUNTER — Other Ambulatory Visit (HOSPITAL_COMMUNITY): Payer: Self-pay

## 2022-01-23 ENCOUNTER — Ambulatory Visit (INDEPENDENT_AMBULATORY_CARE_PROVIDER_SITE_OTHER): Payer: 59 | Admitting: Pulmonary Disease

## 2022-01-23 VITALS — BP 112/70 | HR 74 | Wt 167.0 lb

## 2022-01-23 DIAGNOSIS — R942 Abnormal results of pulmonary function studies: Secondary | ICD-10-CM

## 2022-01-23 DIAGNOSIS — R053 Chronic cough: Secondary | ICD-10-CM

## 2022-01-23 MED ORDER — HYDROCODONE BIT-HOMATROP MBR 5-1.5 MG/5ML PO SOLN: 5.0000 mL | Freq: Four times a day (QID) | ORAL | 0 refills | Status: DC | PRN

## 2022-01-23 MED ORDER — GABAPENTIN 100 MG PO CAPS
200.0000 mg | ORAL_CAPSULE | Freq: Three times a day (TID) | ORAL | 2 refills | Status: DC
  Filled 2022-01-23: qty 180, 30d supply, fill #0

## 2022-01-23 MED ORDER — GABAPENTIN 100 MG PO CAPS: 200.0000 mg | ORAL_CAPSULE | Freq: Three times a day (TID) | ORAL | 2 refills | Status: DC

## 2022-01-23 MED ORDER — HYDROCODONE BIT-HOMATROP MBR 5-1.5 MG/5ML PO SOLN
5.0000 mL | Freq: Four times a day (QID) | ORAL | 0 refills | Status: DC | PRN
  Filled 2022-01-23: qty 120, 6d supply, fill #0

## 2022-01-23 NOTE — Patient Instructions (Signed)
Chronic cough ?--START gabapentin 100 mg three times daily for one week followed by 200 mg three times daily ?--REFER to [facility] for second opinion ?--Rx hycodan for night time relief ? ?Reduced DLCO ?--Recommend PFTs in 12-18 months to monitor DLCO ?

## 2022-01-23 NOTE — Progress Notes (Signed)
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  Synopsis: She has had chronic cough that began in October 2022 (four months) that has progressively worsened. Cough is gagging and occurs daily. Exertion will worsen it including walking upstairs. Cold air has worsens it. Will also be aggravated while eating. Denies heartburn and chest pressure. Has post-tussive emesis. This interrupts her when she is talking at work. 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 The cough is episodic 3-4 times a day and wont stop for up to a minute. Sometimes she feels like she can't breathe and wheezing. Will normalize after a few days but she feels exhausted. Unable to sing a full song. Laying down improves her symptoms. Does not awaken her but has started coughing at night. Hx childhood chronic bronchitis requiring multiple antibiotic courses. Started from 62 to 79 years old. Less symptoms after reaching her 88s.  Seen by PCP and has been using Symbicort for two weeks and does not feel like it is effective. Prednisone course completely resolved her symptoms. She is on omeprazole 40 mg and has decreased her caffeine intake with no change. She uses flonase 1 spray per nare once a day. She has been seen by ENT 10/19/21 with normal exam.   01/01/22 She was only able to take symbicort for three days but had to discontinue due to nausea. She was seen by GI on 12/29/2021.  Her symptoms have not been improved on PPI for 2 months.  She is scheduled for an EGD. She was also seen the ED on 12/29/2021 for left-sided rib injury in setting of chronic cough.   Chest x-ray negative for any acute issues.  O2 saturations were 96%.  She was ordered codeine cough medication however pharmacy did not carry it so prescription was changed to [facility] 03/21/2024. No improvement in cough with this med. Denies shortness of breath or wheezing.  01/23/22  She continues to have cough. And the cough is now associated with post-tussive emesis. She was prescribed prednisone with improvement however does not want to be dependent on this. Reports hoarseness that worsens by the end of the day. Strong odors like perfume and cooking spices will trigger her symptoms.  Social History: Educational administration Frequent childhood exacerbations Mother - asthma Son passed away 2 years ago Development worker, international aid  Past Medical History:  Diagnosis Date   Breast cancer ([place])    left side     Family History  Problem Relation Age of Onset   Diabetes Mother    Alzheimer's disease Mother    Skin cancer Father    Cancer Father        multiple myeloma   Colon cancer Neg Hx    Esophageal cancer Neg Hx    Stomach cancer Neg Hx    Rectal cancer Neg Hx      Social History   Occupational History   Not on file  Tobacco Use   Smoking status: Never   Smokeless tobacco: Never  Vaping Use   Vaping Use: Never used  Substance and Sexual Activity   Alcohol use: Not Currently  Drug use: Not Currently   Sexual activity: Not on file    Allergies  Allergen Reactions   Penicillins      Outpatient Medications Prior to Visit  Medication Sig Dispense Refill   benzonatate (TESSALON) 200 MG capsule Take 1 capsule (200 mg total) by mouth 2 (two) times daily as needed for cough. 20 capsule 0   cetirizine (ZYRTEC) 10 MG tablet TAKE 1 TABLET (10 MG TOTAL) BY MOUTH DAILY. FOR DRAINAGE AND COUGH 90 tablet 0   fluticasone (FLONASE) 50 MCG/ACT nasal spray Place 2 sprays into both nostrils daily. 16 g 0   HYDROcodone bit-homatropine (HYCODAN) 5-1.5 MG/5ML syrup Take 5 mLs by mouth every 6 (six) hours  as needed for cough. 473 mL 0   meloxicam (MOBIC) 15 MG tablet Take 15 mg by mouth daily. PRN for Knee pain     omeprazole (PRILOSEC) 40 MG capsule TAKE 1 CAPSULE (40 MG TOTAL) BY MOUTH DAILY. FOR COUGH 90 capsule 0   No facility-administered medications prior to visit.    Review of Systems  Constitutional:  Negative for chills, diaphoresis, fever, malaise/fatigue and weight loss.  HENT:  Negative for congestion.   Respiratory:  Positive for cough. Negative for hemoptysis, sputum production, shortness of breath and wheezing.   Cardiovascular:  Negative for chest pain, palpitations and leg swelling.  Gastrointestinal:  Positive for nausea and vomiting.    Objective:   Vitals:   01/23/22 1558  BP: 112/70  Pulse: 74  SpO2: 97%  Weight: 167 lb (75.8 kg)   SpO2: 97 % O2 Device: None (Room air)  Physical Exam: General: Well-appearing, no acute distress HENT: [place], AT Eyes: EOMI, no scleral icterus Respiratory: Clear to auscultation bilaterally.  No crackles, wheezing or rales Cardiovascular: RRR, -M/R/G, no JVD Extremities:-Edema,-tenderness Neuro: AAO x4, CNII-XII grossly intact Psych: Normal mood, normal affect  Data Reviewed:  Imaging: CXR 09/18/21 - No infiltrate effusion or edema. Mild bronchitic changes. Minimal right apical scarring CXR 12/29/2021-hyperinflation.  No evidence of infiltrate, effusion or edema.  No rib fractures. CT Chest 01/17/22 - No evidence of ILD. Mild bilateral air trapping. Small solid nodules in RUL measuring 91m and 4.5 mm.  PFT: 12/27/2021 FVC 3.27 (84%) FEV1 2.82 (94%) ratio 79 TLC 89% DLCO 71% Interpretation: No obstructive or restrictive defect.  No significant bronchodilator response however does not preclude clinical benefit if perceived.  Mildly reduced gas exchange.  Recommend clinical correlation.  Echo: EF 55-60%. No WMA. Grade II diastolic. RV function and size normal. RVSP 31.6. Mild mitral valve regurg  Labs: CBC    Component Value  Date/Time   WBC 5.8 09/18/2021 1044   RBC 4.35 09/18/2021 1044   HGB 12.1 09/18/2021 1044   HCT 37.0 09/18/2021 1044   PLT 238.0 09/18/2021 1044   MCV 85.1 09/18/2021 1044   MCHC 32.6 09/18/2021 1044   RDW 14.3 09/18/2021 1044   LYMPHSABS 1.5 09/18/2021 1044   MONOABS 0.4 09/18/2021 1044   EOSABS 0.1 09/18/2021 1044   BASOSABS 0.0 09/18/2021 1044   Absolute eos 09/18/21 - 100     Assessment & Plan:   61year old female never smoker with childhood bronchitis, hx breast cancer s/p left mastectomy and allergic rhinitis who presents for follow-up for refractory cough. Common causes of cough were discussed including upper airway cough syndrome, reflux and undiagnosed obstructive lung disease. We reviewed evaluation and management for cough as noted below.  PFTs were negative for obstructive and restrictive lung disease. DLCO was mildly reduced  however work-up with HRCT and echo unrevealing. ENT exam at [facility] in 10/2021 normal. GI work-up including EGD 01/03/22 with small 1 cm hiatal hernia otherwise acid reflux not thought to be the issue. We discussed optimizing allergy regimen and trial of gabapentin for refractory cough. She also requested referral to tertiary center.  Chronic cough --START gabapentin 100 mg three times daily for one week followed by 200 mg three times daily --REFER to [facility] for second opinion for UACS --Rx hycodan for night time relief --START loratadine or cetrizine 10 mg once a day. Buy over-the counter --Consider Sinus CT  Reduced DLCO --Recommend PFTs in 12-18 months to monitor DLCO   Health Maintenance Immunization History  Administered Date(s) Administered   Influenza,inj,Quad PF,6+ Mos 08/01/2016, 09/19/2017, 07/28/2018, 09/15/2021   Influenza-Unspecified 08/22/2020   PFIZER(Purple Top)SARS-COV-2 Vaccination 01/19/2020, 02/16/2020, 10/14/2020   Pfizer Covid-19 Vaccine Bivalent Booster 41yr & up 09/15/2021   Tdap 04/20/2011, 03/09/2021   Zoster  Recombinat (Shingrix) 09/12/2019, 12/12/2019   CT Lung Screen - not qualified. Never smoker  Orders Placed This Encounter  Procedures   Ambulatory referral to ENT    Referral Priority:   Routine    Referral Type:   Consultation    Referral Reason:   Specialty Services Required    Requested Specialty:   Otolaryngology    Number of Visits Requested:   1   Meds ordered this encounter  Medications   DISCONTD: HYDROcodone bit-homatropine (HYCODAN) 5-1.5 MG/5ML syrup    Sig: Take 5 mLs by mouth every 6 (six) hours as needed for cough.    Dispense:  473 mL    Refill:  0   DISCONTD: gabapentin (NEURONTIN) 100 MG capsule    Sig: Take 2 capsules (200 mg total) by mouth 3 (three) times daily. Start 1 capsule (100 mg) three times daily for the first week. Then increase to 2 capsules (200 mg) daily    Dispense:  180 capsule    Refill:  2   gabapentin (NEURONTIN) 100 MG capsule    Sig: Take 2 capsules (200 mg total) by mouth 3 (three) times daily. Start 1 capsule (100 mg) three times daily for the first week. Then increase to 2 capsules (200 mg) 3 times a day    Dispense:  180 capsule    Refill:  2    verified sig per secure chat 01-23-22 sdm   HYDROcodone bit-homatropine (HYCODAN) 5-1.5 MG/5ML syrup    Sig: Take 5 mLs by mouth every 6 (six) hours as needed for cough.    Dispense:  180 mL    Refill:  0    Per secure chat give 6 ounces only sdm ok. 6 oz then    Return in about 2 months (around 03/25/2022).   I have spent a total time of 36-minutes on the day of the appointment reviewing prior documentation, coordinating care and discussing medical diagnosis and plan with the patient/family. Past medical history, allergies, medications were reviewed. Pertinent imaging, labs and tests included in this note have been reviewed and interpreted independently by me.  CEthel MD [facility] 01/23/2022 4:15 PM  Office Number [phone]

## 2022-01-24 ENCOUNTER — Encounter: Payer: Self-pay | Admitting: Pulmonary Disease

## 2022-02-13 ENCOUNTER — Other Ambulatory Visit: Payer: Self-pay | Admitting: Primary Care

## 2022-02-13 DIAGNOSIS — R053 Chronic cough: Secondary | ICD-10-CM

## 2022-02-20 ENCOUNTER — Encounter: Payer: Self-pay | Admitting: Gastroenterology

## 2022-04-03 ENCOUNTER — Ambulatory Visit: Payer: 59 | Admitting: Pulmonary Disease

## 2022-04-03 ENCOUNTER — Ambulatory Visit (INDEPENDENT_AMBULATORY_CARE_PROVIDER_SITE_OTHER): Payer: 59 | Admitting: Pulmonary Disease

## 2022-04-03 ENCOUNTER — Encounter: Payer: Self-pay | Admitting: Pulmonary Disease

## 2022-04-03 VITALS — BP 120/74 | HR 72 | Ht 68.0 in | Wt 155.0 lb

## 2022-04-03 DIAGNOSIS — R053 Chronic cough: Secondary | ICD-10-CM

## 2022-04-03 DIAGNOSIS — J3801 Paralysis of vocal cords and larynx, unilateral: Secondary | ICD-10-CM

## 2022-04-03 MED ORDER — GABAPENTIN 100 MG PO CAPS: 300.0000 mg | ORAL_CAPSULE | Freq: Three times a day (TID) | ORAL | 5 refills | Status: DC

## 2022-04-03 NOTE — Progress Notes (Signed)
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  Synopsis: She has had chronic cough that began in October 2022 (four months) that has progressively worsened. Cough is gagging and occurs daily. Exertion will worsen it including walking upstairs. Cold air has worsens it. Will also be aggravated while eating. Denies heartburn and chest pressure. Has post-tussive emesis. This interrupts her when she is talking at work. 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 The cough is episodic 3-4 times a day and wont stop for up to a minute. Sometimes she feels like she can't breathe and wheezing. Will normalize after a few days but she feels exhausted. Unable to sing a full song. Laying down improves her symptoms. Does not awaken her but has started coughing at night. Hx childhood chronic bronchitis requiring multiple antibiotic courses. Started from 45 to 51 years old. Less symptoms after reaching her 35s.  Seen by PCP and has been using Symbicort for two weeks and does not feel like it is effective. Prednisone course completely resolved her symptoms. She is on omeprazole 40 mg and has decreased her caffeine intake with no change. She uses flonase 1 spray per nare once a day. She has been seen by ENT 10/19/21 with normal exam.   01/01/22 She was only able to take symbicort for three days but had to discontinue due to nausea. She was seen by GI on 12/29/2021.  Her symptoms have not been improved on PPI for 2 months.  She is scheduled for an EGD. She was also seen the ED on 12/29/2021 for left-sided rib injury in setting of chronic cough.  Chest x-ray negative for any  acute issues.  O2 saturations were 96%.  She was ordered codeine cough medication however pharmacy did not carry it so prescription was changed to [facility] 03/21/2024. No improvement in cough with this med. Denies shortness of breath or wheezing.  01/23/22  She continues to have cough. And the cough is now associated with post-tussive emesis. She was prescribed prednisone with improvement however does not want to be dependent on this. Reports hoarseness that worsens by the end of the day. Strong odors like perfume and cooking spices will trigger her symptoms.  04/03/22 Since our last visit she has been evaluated with [facility] and diagnosed with vocal fold paralysis and dysphonia. Since starting gabapentin has had improvement with cough. Denies drowsiness. She continues to have chronic cough that overall has improved with meds but hoarseness persists and this has made her job difficulty.   Social History: Educational administration Frequent childhood exacerbations Mother - asthma Son passed away 2 years ago Development worker, international aid  Past Medical History:  Diagnosis Date   Breast cancer ([place])    left side     Family History  Problem Relation Age of Onset   Diabetes Mother    Alzheimer's disease Mother    Skin cancer Father    Cancer Father        multiple myeloma   Colon cancer Neg Hx    Esophageal cancer Neg Hx    Stomach cancer Neg Hx    Rectal cancer Neg Hx  Social History   Occupational History   Not on file  Tobacco Use   Smoking status: Never   Smokeless tobacco: Never  Vaping Use   Vaping Use: Never used  Substance and Sexual Activity   Alcohol use: Not Currently   Drug use: Not Currently   Sexual activity: Not on file    Allergies  Allergen Reactions   Penicillins      Outpatient Medications Prior to Visit  Medication Sig Dispense Refill   benzonatate (TESSALON) 200 MG capsule Take 1 capsule (200 mg total) by mouth 2 (two) times daily as needed for cough. 20 capsule 0    cetirizine (ZYRTEC) 10 MG tablet TAKE 1 TABLET (10 MG TOTAL) BY MOUTH DAILY. FOR DRAINAGE AND COUGH 90 tablet 0   fluticasone (FLONASE) 50 MCG/ACT nasal spray Place 2 sprays into both nostrils daily. 16 g 0   gabapentin (NEURONTIN) 100 MG capsule Take 2 capsules (200 mg total) by mouth 3 (three) times daily. Start 1 capsule (100 mg) three times daily for the first week. Then increase to 2 capsules (200 mg) 3 times a day 180 capsule 2   HYDROcodone bit-homatropine (HYCODAN) 5-1.5 MG/5ML syrup Take 5 mLs by mouth every 6 (six) hours as needed for cough. 180 mL 0   meloxicam (MOBIC) 15 MG tablet Take 15 mg by mouth daily. PRN for Knee pain     omeprazole (PRILOSEC) 40 MG capsule TAKE 1 CAPSULE (40 MG TOTAL) BY MOUTH DAILY. FOR COUGH 90 capsule 0   No facility-administered medications prior to visit.    Review of Systems  Constitutional:  Negative for chills, diaphoresis, fever, malaise/fatigue and weight loss.  HENT:  Negative for congestion.   Respiratory:  Positive for cough. Negative for hemoptysis, sputum production, shortness of breath and wheezing.   Cardiovascular:  Negative for chest pain, palpitations and leg swelling.    Objective:   Vitals:   04/03/22 1516  BP: 120/74  Pulse: 72  SpO2: 99%  Weight: 155 lb (70.3 kg)  Height: _0  (1.727 m)   SpO2: 99 % O2 Device: None (Room air)  Physical Exam: General: Well-appearing, no acute distress HENT: [place], AT Eyes: EOMI, no scleral icterus Respiratory: Clear to auscultation bilaterally.  No crackles, wheezing or rales Cardiovascular: RRR, -M/R/G, no JVD Extremities:-Edema,-tenderness Neuro: AAO x4, CNII-XII grossly intact Psych: Normal mood, normal affect  Data Reviewed:  Imaging: CXR 09/18/21 - No infiltrate effusion or edema. Mild bronchitic changes. Minimal right apical scarring CXR 12/29/2021-hyperinflation.  No evidence of infiltrate, effusion or edema.  No rib fractures. CT Chest 01/17/22 - No evidence of ILD. Mild  bilateral air trapping. Small solid nodules in RUL measuring 28m and 4.5 mm.  PFT: 12/27/2021 FVC 3.27 (84%) FEV1 2.82 (94%) ratio 79 TLC 89% DLCO 71% Interpretation: No obstructive or restrictive defect.  No significant bronchodilator response however does not preclude clinical benefit if perceived.  Mildly reduced gas exchange.  Recommend clinical correlation.  Echo: EF 55-60%. No WMA. Grade II diastolic. RV function and size normal. RVSP 31.6. Mild mitral valve regurg  Labs: CBC    Component Value Date/Time   WBC 5.8 09/18/2021 1044   RBC 4.35 09/18/2021 1044   HGB 12.1 09/18/2021 1044   HCT 37.0 09/18/2021 1044   PLT 238.0 09/18/2021 1044   MCV 85.1 09/18/2021 1044   MCHC 32.6 09/18/2021 1044   RDW 14.3 09/18/2021 1044   LYMPHSABS 1.5 09/18/2021 1044   MONOABS 0.4 09/18/2021 1044   EOSABS 0.1 09/18/2021  1044   BASOSABS 0.0 09/18/2021 1044   Absolute eos 09/18/21 - 100     Assessment & Plan:   61 year old female never smoker with childhood bronchitis, hx breast cancer s/p left mastectomy and allergic rhinitis who presents for follow-up for refractory cough. Common causes of cough were discussed including upper airway cough syndrome, reflux and undiagnosed obstructive lung disease. We reviewed evaluation and management for cough as noted below.  61 year old female never smoker with childhood bronchitis, hx breast cancer s/p left mastectomy and allergic rhinitis who presents for follow-up for refractory cough. Since starting gabapentin she has had partial improvement in her cough.  Work-up: PFTs were negative for obstructive and restrictive lung disease. DLCO was mildly reduced however work-up with HRCT and echo unrevealing. ENT exam at [facility] in 10/2021 normal. GI work-up including EGD 01/03/22 with small 1 cm hiatal hernia otherwise acid reflux not thought to be the issue.   Referred to [facility]NT for second opinion and dx with vocal fold paralysis.  Chronic cough Unilateral  partial vocal fold paralysis Muscle tension dysphonia, severe --INCREASE gabapentin 300 mg three times daily  --Keep follow-up with [facility]  Reduced DLCO --Recommend PFTs in 12-18 months to monitor DLCO   Health Maintenance Immunization History  Administered Date(s) Administered   Influenza,inj,Quad PF,6+ Mos 08/01/2016, 09/19/2017, 07/28/2018, 09/15/2021   Influenza-Unspecified 08/22/2020   PFIZER(Purple Top)SARS-COV-2 Vaccination 01/19/2020, 02/16/2020, 10/14/2020   Pfizer Covid-19 Vaccine Bivalent Booster 8yr & up 09/15/2021   Tdap 04/20/2011, 03/09/2021   Zoster Recombinat (Shingrix) 09/12/2019, 12/12/2019   CT Lung Screen - not qualified. Never smoker  No orders of the defined types were placed in this encounter.  Meds ordered this encounter  Medications   gabapentin (NEURONTIN) 100 MG capsule    Sig: Take 3 capsules (300 mg total) by mouth 3 (three) times daily.    Dispense:  270 capsule    Refill:  5    verified sig per secure chat 01-23-22 sdm    Return in about 6 months (around 10/04/2022).   I have spent a total time of 32-minutes on the day of the appointment including chart review, data review, collecting history, coordinating care and discussing medical diagnosis and plan with the patient/family. Past medical history, allergies, medications were reviewed. Pertinent imaging, labs and tests included in this note have been reviewed and interpreted independently by me.  CAlexandria MD [facility] 04/03/2022 3:17 PM  Office Number [phone]

## 2022-04-03 NOTE — Patient Instructions (Addendum)
Chronic cough ?Unilateral partial vocal fold paralysis ?Muscle tension dysphonia, severe ?--INCREASE gabapentin 300 mg three times daily  ?--Keep follow-up with [facility] ? ?Follow-up with me in 6 months ?

## 2022-04-06 ENCOUNTER — Encounter: Payer: Self-pay | Admitting: Pulmonary Disease

## 2022-04-06 DIAGNOSIS — J3801 Paralysis of vocal cords and larynx, unilateral: Secondary | ICD-10-CM | POA: Insufficient documentation

## 2022-04-13 ENCOUNTER — Encounter: Payer: Self-pay | Admitting: Gastroenterology

## 2022-04-13 ENCOUNTER — Ambulatory Visit (INDEPENDENT_AMBULATORY_CARE_PROVIDER_SITE_OTHER): Payer: 59 | Admitting: Gastroenterology

## 2022-04-13 VITALS — BP 110/68 | HR 76 | Ht 68.0 in | Wt 151.6 lb

## 2022-04-13 DIAGNOSIS — R059 Cough, unspecified: Secondary | ICD-10-CM | POA: Diagnosis not present

## 2022-04-13 DIAGNOSIS — K219 Gastro-esophageal reflux disease without esophagitis: Secondary | ICD-10-CM | POA: Diagnosis not present

## 2022-04-13 NOTE — Patient Instructions (Signed)
If you are age 60 or older, your body mass index should be between 23-30. Your Body mass index is 23.05 kg/m. If this is out of the aforementioned range listed, please consider follow up with your Primary Care Provider.  If you are age 25 or younger, your body mass index should be between 19-25. Your Body mass index is 23.05 kg/m. If this is out of the aformentioned range listed, please consider follow up with your Primary Care Provider.   ________________________________________________________  The [facility] providers would like to encourage you to use [facility] to communicate with providers for non-urgent requests or questions.  Due to long hold times on the telephone, sending your provider a message by [facility] may be a faster and more efficient way to get a response.  Please allow 48 business hours for a response.  Please remember that this is for non-urgent requests.  _______________________________________________________  Dennis Bast are scheduled with Dr Bryan Lemma on 05-17-22 at 9:00am to discuss possible TIF procedure.  Thank you for entrusting me with your care and choosing [facility].  Dr Ardis Hughs

## 2022-04-13 NOTE — Progress Notes (Signed)
Review of pertinent gastrointestinal problems: 1.  Chronic cough evaluation.  EGD 12/2021 Hill grade three 1 cm hiatal hernia otherwise normal examination.  Zero improvement on antiacid medicines, very unlikely cough is acid related.  Barium esophagram 12/2021 showed "mild esophageal reflux".  2023 ENT [facility] evaluation felt that she had partial vocal cord paralysis, muscle tension dysphonia.  [facility] input helpful as well.   HPI: This is a very pleasant 61 year old woman whom I last saw the time of an upper endoscopy about 3 months ago.  See that result summarized above.  She is "much much better" since she has started taking gabapentin, currently on 200 mg 3 times daily.  Her cough is much improved.  She still does have spasms of cough that occur every other day or so.  These come out of nowhere, sometimes there after she eats.  They can be debilitating and lead to her to vomit.  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  She will sometimes take Tums.  She never has heartburn.  She does sometimes have scratchy throat on the left side usually while eating.  She has lost 35 pounds in the past 8 or 9 months since this all started.  She has just really had to modify her diet, she is on a very appropriate GERD type protocol diet.   ROS: complete GI ROS as described in HPI, all other review negative.  Constitutional:  No unintentional weight loss   Past Medical History:  Diagnosis Date   Breast cancer ([place])    left side    Past Surgical History:  Procedure Laterality Date   ABDOMINAL HYSTERECTOMY  1995   APPENDECTOMY     MASTECTOMY     ROTATOR CUFF REPAIR Right     Current Outpatient Medications  Medication Instructions   gabapentin (NEURONTIN) 300 mg, Oral, 3 times daily   meloxicam (MOBIC) 15 mg, Oral, Daily, PRN for Knee pain    Allergies as of 04/13/2022 - Review Complete 04/13/2022  Allergen Reaction Noted   Penicillins  03/24/2019     Family History  Problem Relation Age of Onset   Diabetes Mother    Alzheimer's disease Mother    Skin cancer Father    Cancer Father        multiple myeloma   Colon cancer Neg Hx    Esophageal cancer Neg Hx    Stomach cancer Neg Hx    Rectal cancer Neg Hx     Social History   Socioeconomic History   Marital status: Married    Spouse name: Not on file   Number of children: Not on file   Years of education: Not on file   Highest education level: Not on file  Occupational History   Not on file  Tobacco Use   Smoking status: Never   Smokeless tobacco: Never  Vaping Use   Vaping Use: Never used  Substance and Sexual Activity   Alcohol use: Not Currently   Drug use: Not Currently   Sexual activity: Not on file  Other Topics Concern   Not on file  Social History Narrative   Not on file   Social Determinants of Health   Financial Resource Strain: Not on file  Food Insecurity: Not on file  Transportation Needs: Not on file  Physical Activity: Not on file  Stress: Not on file  Social Connections: Not on file  Intimate Partner Violence: Not on file    Physical Exam:  Wt 151 lb 9.6 oz (68.8 kg)   BMI 23.05 kg/m  Constitutional: generally well-appearing Psychiatric: alert and oriented x3 Abdomen: soft, nontender, nondistended, no obvious ascites, no peritoneal signs, normal bowel sounds No peripheral edema noted in lower extremities  Assessment and plan: 60 y.o. female with small hiatal hernia, GERD on barium esophagram, chronic cough, intermittent vomiting  Her symptoms are "much much better" since she has started taking gabapentin.  She is currently at 200 mg 3 times daily.  She is completely off of proton pump inhibitors because they have never at all made a difference for her.  She does periodically take Tums.  We had a nice discussion today about gastroesophageal reflux.  She understands that we have medicines for the acidic component of refluxing stomach  fluid but we do not have medicines for alkali component.  Once daily and then twice daily proton pump inhibitor did not help at all for her symptoms and so I do think that she is more bothered by nonacid refluxing fluid.  She is quite a bit better but she is still significantly impacted by these problems that all started about 6 or 8 months ago.  I am going to refer her to one of my partners, Dr. Bryan Lemma who has extensive experience and expertise in GERD, TIF procedure.   Please see the "Patient Instructions" section for addition details about the plan.  Owens Loffler, MD [facility] 04/13/2022, 8:45 AM   Total time on date of encounter was 35 minutes (this included time spent preparing to see the patient reviewing records; obtaining and/or reviewing separately obtained history; performing a medically appropriate exam and/or evaluation; counseling and educating the patient and family if present; ordering medications, tests or procedures if applicable; and documenting clinical information in the health record).

## 2022-05-11 ENCOUNTER — Emergency Department
Admission: EM | Admit: 2022-05-11 | Discharge: 2022-05-11 | Disposition: A | Discharge status: Home / Self Care | Payer: No Typology Code available for payment source | Attending: Emergency Medicine | Admitting: Emergency Medicine

## 2022-05-11 ENCOUNTER — Emergency Department: Payer: 59

## 2022-05-11 ENCOUNTER — Other Ambulatory Visit: Payer: Self-pay | Admitting: Primary Care

## 2022-05-11 ENCOUNTER — Other Ambulatory Visit: Payer: Self-pay

## 2022-05-11 ENCOUNTER — Encounter: Payer: Self-pay | Admitting: Emergency Medicine

## 2022-05-11 DIAGNOSIS — S2231XA Fracture of one rib, right side, initial encounter for closed fracture: Secondary | ICD-10-CM | POA: Diagnosis not present

## 2022-05-11 DIAGNOSIS — W01198A Fall on same level from slipping, tripping and stumbling with subsequent striking against other object, initial encounter: Secondary | ICD-10-CM | POA: Diagnosis not present

## 2022-05-11 DIAGNOSIS — Y990 Civilian activity done for income or pay: Secondary | ICD-10-CM | POA: Diagnosis not present

## 2022-05-11 DIAGNOSIS — S299XXA Unspecified injury of thorax, initial encounter: Secondary | ICD-10-CM | POA: Diagnosis present

## 2022-05-11 DIAGNOSIS — R053 Chronic cough: Secondary | ICD-10-CM

## 2022-05-11 MED ORDER — OXYCODONE HCL 5 MG PO TABS: 5.0000 mg | ORAL_TABLET | Freq: Three times a day (TID) | ORAL | 0 refills | Status: DC | PRN

## 2022-05-11 MED ORDER — LIDOCAINE 5 % EX PTCH: 1.0000 | MEDICATED_PATCH | Freq: Two times a day (BID) | CUTANEOUS | 0 refills | Status: AC

## 2022-05-11 MED ORDER — LIDOCAINE 5 % EX PTCH
1.0000 | MEDICATED_PATCH | CUTANEOUS | Status: DC
  Administered 2022-05-11: 1 via TRANSDERMAL
  Filled 2022-05-11: qty 1

## 2022-05-11 MED ORDER — OXYCODONE HCL 5 MG PO TABS
5.0000 mg | ORAL_TABLET | Freq: Once | ORAL | Status: AC
  Administered 2022-05-11: 5 mg via ORAL
  Filled 2022-05-11: qty 1

## 2022-05-11 NOTE — ED Triage Notes (Addendum)
C/O tripping at work yesterday, falling backward and hitting middle of lower back on a ledge.  Arrives today C/O back pain.  Patient has WC form from work.  Oxycodone taken approximately 45 minutes PTA

## 2022-05-11 NOTE — ED Notes (Signed)
The pt was wheeled to room 42. The pt requested to stay in the wheel chair with a pillow behind her back.

## 2022-05-11 NOTE — ED Notes (Signed)
25 yof with a c/c of mid back pain, primarily on the left side. The pt advised she tripped yesterday at work and landed backwards on a ledge. The pt denies any loss of consciousness.

## 2022-05-17 ENCOUNTER — Ambulatory Visit (INDEPENDENT_AMBULATORY_CARE_PROVIDER_SITE_OTHER): Payer: 59 | Admitting: Gastroenterology

## 2022-05-17 ENCOUNTER — Encounter: Payer: Self-pay | Admitting: Gastroenterology

## 2022-05-17 VITALS — BP 120/74 | HR 74 | Ht 68.5 in | Wt 139.8 lb

## 2022-05-17 DIAGNOSIS — R634 Abnormal weight loss: Secondary | ICD-10-CM | POA: Diagnosis not present

## 2022-05-17 DIAGNOSIS — R053 Chronic cough: Secondary | ICD-10-CM | POA: Diagnosis not present

## 2022-05-17 DIAGNOSIS — R112 Nausea with vomiting, unspecified: Secondary | ICD-10-CM

## 2022-05-17 NOTE — Progress Notes (Signed)
Chief Complaint:    GERD, chronic cough, evaluation for antireflux surgery   HPI:     Alice Cherry is a 61 y.o. female with a history of breast cancer s/p left mastectomy, allergic rhinitis, referred to me by Dr. Ardis Hughs for evaluation of chronic cough and consideration for antireflux intervention with Transoral Incisionless Fundoplication (TIF).   She reports essentially acute cough starting 08/2021 which has been progressive and unremitting.  Then developed posttussive emesis in 11/2021 with emesis once/week.  Has continued with uncontrollable coughing.  Coughing can occur with eating, but does not tend to be postprandial. Coughing with phonation and feeling of "grasping for air". No HB, regurgitation. No change with steroids, inhalers, etc. Also with "lump in the throat" and more recently with intermittent solid food dysphagia.  Has had 45# weight loss since symptoms onset, which has been attributed in large part to dietary modifications as a means to try to limit symptoms or identify exacerbating foods.  Try to maintain at least 800-calorie/day.  Never any improvement with trial of PPIs in the past, so these have all been stopped for some months now.  Will take 1 Tums/day now.  Cough has improved since gabapentin and dietary mods.   Doing vocal cord therapy. Decreased coffee from 3--> 1 cup/day.    GERD history: -Index symptoms: Chronic cough, intermittent emesis, hoarseness -Exacerbating features: Tomato based sauce, high fat, chocolate, soda, juice -Medications trialed: Omeprazole, Pepcid, Zantac -Current medications: Tums most evenings -Complications: LES laxity  GERD evaluation: -Last EGD: 12/2021 -Barium esophagram: 12/2021: Mild esophageal reflux.  Normal motility -Esophageal Manometry: Ordered today -pH/Impedance: Ordered today -Bravo: None  Endoscopic History: - EGD (12/2021): Hill grade 3 valve, 1 cm HH  She has been evaluated in the [facility], last seen by  Dr. Loanne Drilling on 04/03/2022.  Was also evaluated by [facility] who felt she had partial vocal cord paralysis, muscle tension dysphonia.  Cough improved since starting gabapentin 200 mg tid.  Gabapentin was increased to 300 mg tid last month by Pulmonary, but didn't tolerate the higher dose; back to 200 mg TID now.  Was last seen in the [facility] clinic on 04/12/2022 with video stroboscopy performed.  At follow-up in the ENT [facility] on 05/09/2022 with reported worsening symptoms; increased cough and emesis.  She presents to the office with her husband.    Review of systems:     No chest pain, no SOB, no fevers, no urinary sx   Past Medical History:  Diagnosis Date   Breast cancer ([place])    left side    Patient's surgical history, family medical history, social history, medications and allergies were all reviewed in Epic    Current Outpatient Medications  Medication Sig Dispense Refill   gabapentin (NEURONTIN) 100 MG capsule Take 3 capsules (300 mg total) by mouth 3 (three) times daily. 270 capsule 5   meloxicam (MOBIC) 15 MG tablet Take 15 mg by mouth daily. PRN for Knee pain     oxyCODONE (ROXICODONE) 5 MG immediate release tablet Take 1 tablet (5 mg total) by mouth every 8 (eight) hours as needed. 10 tablet 0   No current facility-administered medications for this visit.    Physical Exam:     There were no vitals taken for this visit.  GENERAL:  Pleasant female in NAD PSYCH: : Cooperative, normal affect EENT:  conjunctiva pink, mucous membranes moist, neck supple without masses CARDIAC:  RRR, no murmur heard, no peripheral edema PULM: Normal  respiratory effort, lungs CTA bilaterally, no wheezing ABDOMEN:  Nondistended SKIN:  turgor, no lesions seen Musculoskeletal:  Normal muscle tone, normal strength NEURO: Alert and oriented x 3, no focal neurologic deficits   IMPRESSION and PLAN:    1) Chronic cough 2) Posttussive emesis  We a long conversation today regarding the  pathophysiology of reflux.  Discussed the difference between typical and atypical reflux symptoms, to include extra esophageal manifestations of reflux.  No change in symptoms despite high-dose PPI and multiple different acid suppression medications.  Not entirely clear that reflux is the primary driver of her chronic cough.  Did have mild gastroesophageal reflux noted on barium esophagram, but otherwise EGD without erosive esophagitis.  There was LES laxity with Hill grade 3 valve, but no significant axial hernia.  We discussed the role/utility of antireflux surgery, to include cTIF, and would certainly want to have clear objective evidence of reflux along with establishment of symptom association prior to proceeding with antireflux surgery.  Plan for the following:  - Esophageal Manometry and pH/impedance testing.  Does not take PPI, and the study should be performed OFF PPI. - Did discuss potential delay for obtaining EM and pH/Mii.  As she does not take PPI or H2 blocker, can place on cancellation list - In the meantime, continue antireflux lifestyle/dietary modifications with avoidance of exacerbating foods - Ok to resume Tums - Continue follow-up in the Pulmonary and ENT clinics as planned - Continue gabapentin as currently prescribed as this has had symptomatic benefit  3) Weight loss - 2/2 above symptoms - Continue trying to maintain caloric intake with at least stable weight with no further loss  RTC after studies competed     I spent 45 minutes of time, including in depth chart review, independent review of results as outlined above, communicating results with the patient directly, face-to-face time with the patient, coordinating care, ordering studies and medications as appropriate, and documentation.     Lavena Bullion ,DO, FACG 05/17/2022, 8:50 AM

## 2022-05-17 NOTE — Patient Instructions (Signed)
If you are age 61 or younger, your body mass index should be between 19-25. Your Body mass index is 20.95 kg/m. If this is out of the aformentioned range listed, please consider follow up with your Primary Care Provider.   __________________________________________________________  The [place] [facility] providers would like to encourage you to use [facility] to communicate with providers for non-urgent requests or questions.  Due to long hold times on the telephone, sending your provider a message by [facility] may be a faster and more efficient way to get a response.  Please allow 48 business hours for a response.  Please remember that this is for non-urgent requests.   Due to recent changes in healthcare laws, you may see the results of your imaging and laboratory studies on MyChart before your provider has had a chance to review them.  We understand that in some cases there may be results that are confusing or concerning to you. Not all laboratory results come back in the same time frame and the provider may be waiting for multiple results in order to interpret others.  Please give [place] 48 hours in order for your provider to thoroughly review all the results before contacting the office for clarification of your results.   You have been scheduled for an esophageal manometry and 24 hour PH Probe test at [facility] on 09/12/22 at 12:30 pm. Please arrive 30 minutes prior to your procedure for registration. You will need to go to outpatient registration (1st floor of the hospital) first. Make certain to bring your insurance cards as well as a complete list of medications.  Please remember the following:  1) Do not take any muscle relaxants, xanax (alprazolam) or ativan for 1 day prior to your test as well as the day of the test.  2) Nothing to eat or drink after 12:00 midnight on the night before your test.  3) Hold all diabetic medications/insulin the morning of the test. You may eat and take your  medications after the test.  4) For 7 days prior to your test do not take: Dexilant, Prevacid, Nexium, Protonix, Aciphex, Zegerid, Pantoprazole, Prilosec or omeprazole.  5) For 7 days prior to your test, do not take: Reglan, Tagamet, Zantac, Axid or Pepcid.  6) You MAY use an antacid such as Rolaids or Tums up to 12 hours prior to your test.  It will take at least 2 weeks to receive the results of this test from your physician.  ------------------------------------------ ABOUT ESOPHAGEAL MANOMETRY Esophageal manometry (muh-NOM-uh-tree) is a test that gauges how well your esophagus works. Your esophagus is the long, muscular tube that connects your throat to your stomach. Esophageal manometry measures the rhythmic muscle contractions (peristalsis) that occur in your esophagus when you swallow. Esophageal manometry also measures the coordination and force exerted by the muscles of your esophagus.  During esophageal manometry, a thin, flexible tube (catheter) that contains sensors is passed through your nose, down your esophagus and into your stomach. Esophageal manometry can be helpful in diagnosing some mostly uncommon disorders that affect your esophagus.  Why it's done Esophageal manometry is used to evaluate the movement (motility) of food through the esophagus and into the stomach. The test measures how well the circular bands of muscle (sphincters) at the top and bottom of your esophagus open and close, as well as the pressure, strength and pattern of the wave of esophageal muscle contractions that moves food along.  What you can expect Esophageal manometry is an outpatient procedure  done without sedation. Most people tolerate it well. You may be asked to change into a hospital gown before the test starts.  During esophageal manometry  While you are sitting up, a member of your health care team sprays your throat with a numbing medication or puts numbing gel in your nose or both.  A catheter  is guided through your nose into your esophagus. The catheter may be sheathed in a water-filled sleeve. It doesn't interfere with your breathing. However, your eyes may water, and you may gag. You may have a slight nosebleed from irritation.  After the catheter is in place, you may be asked to lie on your back on an exam table, or you may be asked to remain seated.  You then swallow small sips of water. As you do, a computer connected to the catheter records the pressure, strength and pattern of your esophageal muscle contractions.  During the test, you'll be asked to breathe slowly and smoothly, remain as still as possible, and swallow only when you're asked to do so.  A member of your health care team may move the catheter down into your stomach while the catheter continues its measurements.  The catheter then is slowly withdrawn. The test usually lasts 20 to 30 minutes.  After esophageal manometry  When your esophageal manometry is complete, you may return to your normal activities  This test typically takes 30-45 minutes to complete. ________________________________________________________________________________  ABOUT 24 HOUR PH PROBE An esophageal pH test measures and records the pH in your esophagus to determine if you have gastroesophageal reflux disease (GERD). The test can also be done to determine the effectiveness of medications or surgical treatment for GERD. What is esophageal reflux? Esophageal reflux is a condition in which stomach acid refluxes or moves back into the esophagus (the "food pipe" leading from the mouth to the stomach). How does the esophageal pH test work? A thin, small tube with an acid sensing device on the tip is gently passed through your nose, down the esophagus ("food tube"), and positioned about 2 inches above the lower esophageal sphincter. The tube is secured to the side of your face with clear tape. The end of the tube exiting from your nose is attached to  a portable recorder that is worn on your belt or over your shoulder. The recorder has several buttons on it that you will press to mark certain events. A nurse will review the monitoring instructions with you. Once the test has begun, what do I need to know and do? Activity: Follow your usual daily routine. Do not reduce or change your activities during the monitoring period. Doing so can make the monitoring results less useful.  Note: do not take a tub bath or shower; the equipment can't get wet.  Eating: Eat your regular meals at the usual times. If you do not eat during the monitoring period, your stomach will not produce acid as usual, and the test results will not be accurate. Eat at least 2 meals a day. Eat foods that tend to increase your symptoms (without making yourself miserable). Avoid snacking. Do not suck on hard candy or lozenges and do not chew gum during the monitoring period.  Lying down: Remain upright throughout the day. Do not lie down until you go to bed (unless napping or lying down during the day is part of your daily routine).  Medications: Continue to follow your doctor's advice regarding medications to avoid during the monitoring period.  Recording symptoms:  Press the appropriate button on your recorder when symptoms occur (as discussed with the nurse).  Recording events: Record the time you start and stop eating and drinking (anything other than plain water). Record the time you lie down (even if just resting) and when you get back up. The nurse will explain this.  Unusual symptoms or side effects. If you think you may be experiencing any unusual symptoms or side effects, call your doctor.  You will return the next day to have the tube removed. The information on the recorder will be downloaded to a computer and the results will be analyzed.  After completion of the study Resume your normal diet and medications. Lozenges or hard candy may help ease any sore throat caused by the  tube.    Thank you for choosing me and [facility].  Vito Cirigliano, D.O.

## 2022-05-18 ENCOUNTER — Encounter: Payer: Self-pay | Admitting: Gastroenterology

## 2022-05-23 NOTE — Telephone Encounter (Signed)
I do apologize for the delay, but that is unfortunately the current situation with the manometry lab.  Hopefully there is a cancellation and we are able to get that test done sooner.  We can look to see if the Esophageal Manometry and pH/impedance test can be ordered directly to be done at [facility].  I am not sure if it can be ordered that way or if she would need to be seen as a referral to their Gastroenterologist first, but we can look into this.  They had been able to do that in the distant past, but I am not sure if that is still an option.  Otherwise, we could potentially do an EGD with Bravo placement, but for her specific symptoms, I would much prefer the transnasal pH/impedance probe given the added diagnostic utility.  Additionally, would still want Esophageal Manometry, and this cannot be achieved with Bravo placement.

## 2022-05-24 ENCOUNTER — Ambulatory Visit (INDEPENDENT_AMBULATORY_CARE_PROVIDER_SITE_OTHER): Payer: 59 | Admitting: Primary Care

## 2022-05-24 ENCOUNTER — Encounter: Payer: Self-pay | Admitting: Primary Care

## 2022-05-24 VITALS — BP 110/74 | HR 85 | Temp 97.3°F | Ht 68.5 in | Wt 136.0 lb

## 2022-05-24 DIAGNOSIS — R634 Abnormal weight loss: Secondary | ICD-10-CM | POA: Diagnosis not present

## 2022-05-24 DIAGNOSIS — G8918 Other acute postprocedural pain: Secondary | ICD-10-CM | POA: Insufficient documentation

## 2022-05-24 DIAGNOSIS — F4321 Adjustment disorder with depressed mood: Secondary | ICD-10-CM

## 2022-05-24 DIAGNOSIS — S2231XS Fracture of one rib, right side, sequela: Secondary | ICD-10-CM

## 2022-05-24 DIAGNOSIS — R053 Chronic cough: Secondary | ICD-10-CM | POA: Diagnosis not present

## 2022-05-24 DIAGNOSIS — K5909 Other constipation: Secondary | ICD-10-CM

## 2022-05-24 DIAGNOSIS — J3801 Paralysis of vocal cords and larynx, unilateral: Secondary | ICD-10-CM

## 2022-05-24 DIAGNOSIS — S2231XA Fracture of one rib, right side, initial encounter for closed fracture: Secondary | ICD-10-CM | POA: Insufficient documentation

## 2022-05-24 MED ORDER — LINACLOTIDE 145 MCG PO CAPS: 145.0000 ug | ORAL_CAPSULE | Freq: Every day | ORAL | 0 refills | Status: AC

## 2022-05-24 MED ORDER — LIDOCAINE 5 % EX PTCH: 1.0000 | MEDICATED_PATCH | CUTANEOUS | 2 refills | Status: AC

## 2022-05-24 MED ORDER — TRAMADOL HCL 50 MG PO TABS: 50.0000 mg | ORAL_TABLET | Freq: Three times a day (TID) | ORAL | 0 refills | Status: AC | PRN

## 2022-05-24 MED ORDER — MELOXICAM 15 MG PO TABS: 15.0000 mg | ORAL_TABLET | Freq: Every day | ORAL | 0 refills | Status: AC | PRN

## 2022-05-24 MED ORDER — MIRTAZAPINE 7.5 MG PO TABS: 7.5000 mg | ORAL_TABLET | Freq: Every day | ORAL | 0 refills | Status: DC

## 2022-05-24 NOTE — Assessment & Plan Note (Signed)
Unintentional and secondary to her persistent cough, vocal fold paralysis, nonerosive esophageal reflux.  She will work on increasing her calorie intake to 1200 daily. Urgent referral placed to nutritionist to help with weight gain so that she can proceed with surgery if warranted.  Prescription for mirtazapine 7.5 mg at bedtime provided to help with appetite stimulation and weight gain.

## 2022-05-24 NOTE — Patient Instructions (Addendum)
You will be contacted regarding your referral to the nutritionist.  Please let us know if you have not been contacted within two business days.   You may take the Tramadol (pain medication) as needed for severe pain. Do not take with Oxycodone.  You may take Linzess daily as needed for constipation.   Take the mirtazipine 7.5 mg tablets at bedtime for appetite stimulation.  You may take the Meloxicam as needed for post-mastectomy pain.   It was a pleasure to see you today!

## 2022-05-24 NOTE — Progress Notes (Signed)
Subjective:    Patient ID: Alice Cherry, female    DOB: 1961-03-11, 61 y.o.   MRN: [id number]  HPI  Alice Cherry is a very pleasant 61 y.o. female with a history of persistent cough, unilateral vocal fold paralysis, globus sensation, dysphonia, non erosive esophageal reflux disease who presents today for follow-up, constipation, pain, and evaluation of unintentional weight loss.  Her husband joins [place] today.  Chronic history of persistent cough which began in October 2022 approximately 2 weeks after her COVID-19 vaccine.  Evaluated and treated several times in our clinic with various treatments including antibiotics, PPI, oral steroids without improvement.  She then developed once weekly posttussive emesis in January 2023 which progressed to daily posttussive emesis throughout late winter and early spring 2023.  Evaluated by 2 ENT providers, pulmonology, GI, cardiology.  Pulmonology and cardiology work-up negative for cause of symptoms.  She underwent upper endoscopy in February 23 which showed mild esophageal reflux, normal motility.  She was noted to have LES laxity.  Currently following with ENT through [facility] who has diagnosed her with vocal fold dysfunction, muscle tension dysphonia.  She is actively involved with vocal cord therapy.  She is currently managed on gabapentin gender milligrams 3 times daily which is helped significantly with posttussive emesis.  Also following with GI and recently met with Dr. Bryan Lemma. Current plan is to undergo esophageal manometry on 09/12/2022, but she is trying to get this test moved up soon through [facility]. If this test is positive then she will undergo antireflux intervention with transoral incision less fundoplication surgery.  Unfortunately, she has unintentionally lost 45 pounds since symptom onset in the fall 2022.  She will not be a candidate for the needed surgery if her BMI falls below 19 as the procedure will cause weight loss as a side effect.  Today  she presents with several concerns including her progressive weight loss, increased constipation, acute pain from recent rib fracture, increased pain to the left chest wall since her mastectomy years ago,  and her ongoing cough and associated symptoms.    She continues to experience voice hoarseness, recurrent cough, vomiting once weekly, fatigue. She cannot talk for long periods of time and cannot raise her voice louder than a whisper. 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  She is considering long-term disability.    She has no appetite and is force-feeding herself to prevent weight loss.  She is currently eating 500 to 800 cal daily, but her goal is 1200 cal daily.  She is mostly eating smoothies, thin yogurt, protein shakes for which she makes at home, and salad.  She cannot eat most solid foods as this will provoke her symptoms and posttussive vomiting.    Long history of constipation for which she typically produced a bowel movement twice weekly.  Since initiation of gabapentin she has noticed an increase in her constipation and will go several weeks without a bowel movement without an intervention.  She takes Colace and MiraLAX daily, but every 2 weeks she will have to take for Ducolax pills in order to have a bowel movement.  After she takes her to collect pills she cannot leave her home for 48 hours as she has ongoing bowel movements/diarrhea.  She is wondering if there are any other options for constipation treatment. She denies abdominal pain, abdominal bloating.  She did not mention this to GI during her most recent  visit.  She's experiencing laryngospasm's nightly. She does experience insomnia and will have to take Tylenol PM 2-4 times a week.  Unfortunately, local therapy has not been effective.  She is requesting a refill of her Meloxicam for her left mastectomy pain which has increased since her symptoms  began. She's taking Ibuprofen and Tylenol with some improvement but without resolve.   She accidentally fell in her office two weeks ago.  Evaluated in the ED at the time and x-ray revealed closed fracture of 1 rib on the right side without pneumothorax.  CT thoracic spine was negative for acute vertebral injury. She was prescribed oxycodone she took for a few days with improvement. She's been taking oxycodone and lidocaine patches.  Since her ED visit her pain has continued.  She is using oxycodone sparingly but will be running out soon.  She questions what else she can take for severe pain as she does not want to continue oxycodone.  Her husband has been applying ice and lidocaine patches which have helped, but she will be returning to work next week and is concerned.  Wt Readings from Last 3 Encounters:  05/24/22 136 lb (61.7 kg)  05/17/22 139 lb 12.8 oz (63.4 kg)  05/11/22 151 lb 10.8 oz (68.8 kg)       Review of Systems  Constitutional:  Positive for unexpected weight change.  HENT:  Positive for trouble swallowing and voice change.   Respiratory:  Positive for cough.   Gastrointestinal:  Positive for constipation and vomiting. Negative for abdominal pain.  Musculoskeletal:  Positive for arthralgias.  Skin:  Negative for color change.  Psychiatric/Behavioral:  Positive for sleep disturbance.          Past Medical History:  Diagnosis Date   Breast cancer ([place])    left side   Insomnia secondary to situational depression 01/27/2020   Pain and swelling of eyelid 07/20/2021   Pain of left upper extremity 02/02/2021   Vocal cord paralysis     Social History   Socioeconomic History   Marital status: Married    Spouse name: Not on file   Number of children: 2   Years of education: Not on file   Highest education level: Not on file  Occupational History   Occupation: Head of school  Tobacco Use   Smoking status: Never   Smokeless tobacco: Never  Vaping Use   Vaping Use:  Never used  Substance and Sexual Activity   Alcohol use: Not Currently   Drug use: Not Currently   Sexual activity: Not on file  Other Topics Concern   Not on file  Social History Narrative   Not on file   Social Determinants of Health   Financial Resource Strain: Not on file  Food Insecurity: Not on file  Transportation Needs: Not on file  Physical Activity: Not on file  Stress: Not on file  Social Connections: Not on file  Intimate Partner Violence: Not on file    Past Surgical History:  Procedure Laterality Date   Casey Right     Family History  Problem Relation Age of Onset   Diabetes Mother    Alzheimer's disease Mother    Skin cancer Father    Cancer Father        multiple myeloma   Colon cancer Neg Hx    Esophageal cancer Neg Hx    Stomach cancer  Neg Hx    Rectal cancer Neg Hx     Allergies  Allergen Reactions   Penicillins     Current Outpatient Medications on File Prior to Visit  Medication Sig Dispense Refill   gabapentin (NEURONTIN) 100 MG capsule Take 3 capsules (300 mg total) by mouth 3 (three) times daily. (Patient taking differently: Take 300 mg by mouth 3 (three) times daily. 200 mg TID) 270 capsule 5   No current facility-administered medications on file prior to visit.    BP 110/74   Pulse 85   Temp (!) 97.3 F (36.3 C) (Temporal)   Ht 5' 8.5" (1.74 m)   Wt 136 lb (61.7 kg)   SpO2 95%   BMI 20.38 kg/m  Objective:   Physical Exam Constitutional:      General: She is not in acute distress.    Appearance: She is not ill-appearing.  Cardiovascular:     Rate and Rhythm: Normal rate and regular rhythm.  Pulmonary:     Effort: Pulmonary effort is normal.     Breath sounds: Normal breath sounds.     Comments: Dry cough noted intermittently throughout visit Abdominal:     General: Bowel sounds are normal.     Palpations: Abdomen is soft.     Tenderness:  There is no abdominal tenderness.  Musculoskeletal:     Cervical back: Neck supple.  Skin:    General: Skin is warm and dry.           Assessment & Plan:   Problem List Items Addressed This Visit       Respiratory   Unilateral vocal fold paralysis    Secondary to nonerosive esophageal reflux and persistent cough. Reviewed ENT notes from [facility] through care everywhere.          Digestive   Chronic constipation    Progressed since initiation of gabapentin.  Start Linzess 145 mg capsules daily to every other day for constipation. She is working to increase water intake.  She will keep [place] updated.      Relevant Medications   linaclotide (LINZESS) 145 MCG CAPS capsule     Musculoskeletal and Integument   Closed fracture of one rib of right side    ED notes, imaging reviewed.  Agree to discontinue oxycodone. Prescription for tramadol provided to use for breakthrough/severe pain. Prescription for lidocaine patches provided to use as needed. Prescription for meloxicam to use as needed.  Continue to monitor.      Relevant Medications   lidocaine (LIDODERM) 5 %   traMADol (ULTRAM) 50 MG tablet     Other   Adjustment disorder with depressed mood    Overall doing well despite chronic cough and persistent symptoms since fall 2022.  Continue to monitor. Continue off of fluoxetine.      Chronic cough - Primary    Ongoing, secondary to nonerosive esophageal reflux.  Following with GI.  Office notes from GI and ENT reviewed.  Agree to proceed with esophageal manometry, hopefully she can get in sooner through [facility]. Continue gabapentin 200 mg 3 times daily for cough.      Excessive weight loss    Unintentional and secondary to her persistent cough, vocal fold paralysis, nonerosive esophageal reflux.  She will work on increasing her calorie intake to 1200 daily. Urgent referral placed to nutritionist to help with weight gain so that she can proceed with surgery  if warranted.  Prescription for mirtazapine 7.5 mg at bedtime provided to help with appetite  stimulation and weight gain.      Relevant Medications   mirtazapine (REMERON) 7.5 MG tablet   Other Relevant Orders   Amb ref to Medical Nutrition Therapy-MNT   Post-mastectomy pain    Start meloxicam 15 mg daily as needed.      Relevant Medications   meloxicam (MOBIC) 15 MG tablet    60 minutes was spent today with patient, reviewing office notes from epic and care everywhere, and developing assessment and plan.   Pleas Koch, NP

## 2022-05-24 NOTE — Assessment & Plan Note (Signed)
ED notes, imaging reviewed.  Agree to discontinue oxycodone. Prescription for tramadol provided to use for breakthrough/severe pain. Prescription for lidocaine patches provided to use as needed. Prescription for meloxicam to use as needed.  Continue to monitor.

## 2022-05-24 NOTE — Assessment & Plan Note (Signed)
Progressed since initiation of gabapentin.  Start Linzess 145 mg capsules daily to every other day for constipation. She is working to increase water intake.  She will keep [place] updated.

## 2022-05-24 NOTE — Assessment & Plan Note (Signed)
Secondary to nonerosive esophageal reflux and persistent cough. Reviewed ENT notes from [facility] through care everywhere.

## 2022-05-24 NOTE — Assessment & Plan Note (Addendum)
Ongoing, secondary to nonerosive esophageal reflux.  Following with GI.  Office notes from GI and ENT reviewed.  Agree to proceed with esophageal manometry, hopefully she can get in sooner through [facility]. Continue gabapentin 200 mg 3 times daily for cough.  I am happy to support her with her long-term disability claim if she initiates.

## 2022-05-24 NOTE — Assessment & Plan Note (Signed)
Start meloxicam 15 mg daily as needed.

## 2022-05-24 NOTE — Assessment & Plan Note (Signed)
Overall doing well despite chronic cough and persistent symptoms since fall 2022.  Continue to monitor. Continue off of fluoxetine.

## 2022-05-28 ENCOUNTER — Encounter: Payer: 59 | Attending: Primary Care | Admitting: Dietician

## 2022-05-28 ENCOUNTER — Telehealth: Payer: Self-pay

## 2022-05-28 ENCOUNTER — Encounter: Payer: Self-pay | Admitting: Dietician

## 2022-05-28 VITALS — Ht 68.5 in | Wt 137.1 lb

## 2022-05-28 DIAGNOSIS — K219 Gastro-esophageal reflux disease without esophagitis: Secondary | ICD-10-CM

## 2022-05-28 DIAGNOSIS — R634 Abnormal weight loss: Secondary | ICD-10-CM | POA: Diagnosis not present

## 2022-05-28 DIAGNOSIS — Z713 Dietary counseling and surveillance: Secondary | ICD-10-CM | POA: Insufficient documentation

## 2022-05-28 DIAGNOSIS — J3801 Paralysis of vocal cords and larynx, unilateral: Secondary | ICD-10-CM

## 2022-05-28 DIAGNOSIS — R1310 Dysphagia, unspecified: Secondary | ICD-10-CM

## 2022-05-28 NOTE — Patient Instructions (Signed)
Continue to eat every 2-3 hours during the day whether hungry or not.  Include a protein source with every meal and most snacks.; try adding supplemental protein powder in soup

## 2022-05-28 NOTE — Progress Notes (Signed)
Medical Nutrition Therapy: Visit start time: 0955  end time: 1055  Assessment:   Referral Diagnosis: dysphagia, abnormal weight loss Other medical history/ diagnoses: laryngopharyngeal reflux, vocal chord paralysis Psychosocial issues/ stress concerns: none  Medications, supplements: reconciled list in medical record   Preferred learning method:  Visual    Current weight: 137.1lbs Height: 5'8.5"  BMI: 20.54 Patient's personal weight goal: 140s - 150s  Progress and evaluation:  Patient reports having frequent coughing episodes sometimes followed by vomiting over the past 6 months; was diagnosed with laryngoepharyngeal reflux (LPR) and vocal chord paralysis as secondary diagnosis. She lost about 50lbs in that time; some symptoms have been improving and with her efforts to increase intake weight loss seems to have levelled off. She will be having  esophageal manotremy to determine procedures needed to resolve LPR.  She was having an average of 4 episodes of vomiting weekly, which has now improved to about 1 episode weekly. Difficulty swallowing many foods including meats, dense textures such as granola bars, breads. High fat foods cause reflux with severe esophageal pain. Is able to swallow lettuce and most vegetables. Tolerates fruits including bananas, strawberries, melons, applesauce; does not tolerate peaches or citrus fruits. Has not tolerated meal replacement shakes such as Ensure or Boost. She does make smoothies, and is able to tolerate one of the options at [employer]. Considering trying tofu as a protein source. Needs BMI stable above 19 in order to have stomach surgery, as the surgery can result in about 20lbs weight loss.  Food allergies: none known Special diet practices: none listed Patient seeks help with achieving adequate caloric intake to gain weight and prevent additional weight loss.     Dietary Intake:  Usual eating pattern includes 3 meals and 2-3 snacks  per day; tries to eat at least a few bites every 2 hours. Who plans meals/ buys groceries? self Who prepares meals? self  Breakfast: homemade smoothie with Be well protein supplement; peanut butter bnana smoothie; light yogurt (unable to tolerate thick [place] yogurt) Snack: none Lunch: smoothie + small salad or raw veggies Snack: apple juice Supper:  thin slice ham; orzo pasta with chopped shrimp; 7/10 plans spinach salad with egg, some shredded cheese, bacon able to eat 1/2 Snack: none Beverages: water, apple juice, some sweet tea for calories  Physical activity: ADLs due to recent weight loss   Intervention:   Nutrition Care Education:   Basic nutrition: appropriate nutrient balance; appropriate meal and snack schedule  Weight gain: eating small meals/ snacks frequently; inclusion of protein sources with all meals and tolerable options for LPR and dysphagia; adding small amounts, ie 1/2 tsp, of fat to foods, enhancing foods such as soup with protein; likely need for more than patient's current goal of 1000kcal daily to reach goal weight, but working towards goal gradually as tolerated.  Dysphagia: soft, moist, tender foods, small bites, eating meals and snacks often Laryngopharyngeal Reflux: focus on adequate protein for healing with small amounts of fat to increase calories without triggering reflux; frequent, small meals and snacks.  Other intervention notes: Calculated likely calorie needs for weight gain to goal of about 150lbs at 1600 daily. Unable to discuss with patient as she needed to end visit due to another scheduled appointment. Patient has been tracking food intake and most days has been under 1000kcal. Provided guidance for 1000-1100kcal daily with 50% CHO, 30% protein and 20% fat.  Patient states she will try some options discussed and listed in printed resources.  Patient declined scheduling follow up visit at this time.   Nutritional Diagnosis:  [place]-1.1 Swallowing  difficulty and [place]-1.4 Altered GI function As related to laryngopharyngeal reflux, dysphagia.  As evidenced by patient reported symptoms and intolerance for densely textured foods and high fat and acidic foods. -3.2 Unintentional weight loss As related to limited food tolerance and decreased appetite.  As evidenced by patient reported weight loss of about 50lbs, with current BMI of 20.5.   Education Materials given:  IDDSI Phase 5, Phase 6 Nutrition Therapy (NCM) Plate Planner with food lists, sample meal pattern Visit summary with goals/ instructions   Learner/ who was taught:  Patient   Level of understanding: Unable to understand/ needs instruction   Demonstrated degree of understanding via:   Teach back Learning barriers: None   Willingness to learn/ readiness for change: Eager, change in progress   Monitoring and Evaluation:  Dietary intake, exercise, dysphagia and LPR symptoms, and body weight      follow up: prn

## 2022-05-28 NOTE — Telephone Encounter (Signed)
Prior auth started for Lidocaine 5% patches.   Ronnald Collum Key: [facility] - [place] Case ID: [id number] - Rx #: [id number] Waiting for determination.

## 2022-05-29 NOTE — Telephone Encounter (Signed)
Is someone notifying the patient with denials?

## 2022-05-29 NOTE — Telephone Encounter (Signed)
Prior auth for Lidocaine 5% patches has been denied. Ronnald Collum Key: [facility] - [place] Case ID: [id number] - Rx #: [id number]  The request for coverage for LIDOCAINE PAD 5%, use as directed (30 per month), is denied. This decision is based on health plan criteria for LIDOCAINE PAD 5%. This medicine is covered only if: You have one of the following diagnoses: (A) Post-herpetic neuralgia. (B) Neuropathic pain.  Denial letter sent to scanning.

## 2022-05-30 NOTE — Telephone Encounter (Signed)
I have called patient she will try the otc lidocaine patches. She will let our office know if any issues/ questions.

## 2022-05-31 NOTE — Telephone Encounter (Signed)
Spoke with [facility] who stated they needed me to write the ICD-10 codes on the referral form that was filled out and re-fax pt records. [facility] also stated they would fax an updated referral form for future use. Re-faxed referral with ICD-10 codes.

## 2022-06-05 NOTE — Telephone Encounter (Addendum)
Spoke with LaRon at [place] (phone [phone] opt 1) who stated he did not receive fax on 7/13. Re-faxed referral with ICD-10 codes to [phone]. Received fax confirmation and left message for LaRon to verify fax was received.

## 2022-06-18 NOTE — Telephone Encounter (Signed)
Thank you for the update on her. I was able to review the MRI report in Epic and completely agree with canceling the EM and pH study. Will remain available to her as needed.

## 2022-06-19 ENCOUNTER — Telehealth: Payer: Self-pay | Admitting: Primary Care

## 2022-06-19 NOTE — Telephone Encounter (Signed)
Completed form.  Placed in [place]. Ready for pick up.

## 2022-06-19 NOTE — Telephone Encounter (Addendum)
Placed in [place] DNP's in box

## 2022-06-19 NOTE — Telephone Encounter (Signed)
Type of forms received:Parking Placard  Routed GS:[id number] T  Paperwork received by : Gwynn Burly   Individual made aware of 3-5 business day turn around (Y/N): Y  Form completed and patient made aware of charges(Y/N): Y   Faxed to :   Form location: Place in PCP folder

## 2022-06-22 NOTE — Telephone Encounter (Signed)
Form has been picked up.

## 2022-08-07 IMAGING — CT CT CHEST HIGH RESOLUTION
2 of 7 series · 14 of 36 positions shown, 17 images · non-contrast
Comparison: None.

CLINICAL DATA: Reduce DLCO history of COVID



[Series 4: thorax 2.00 cor · coronal · 0.66mm/px · 3 of 146 slices shown]
[im 30/146  lung]
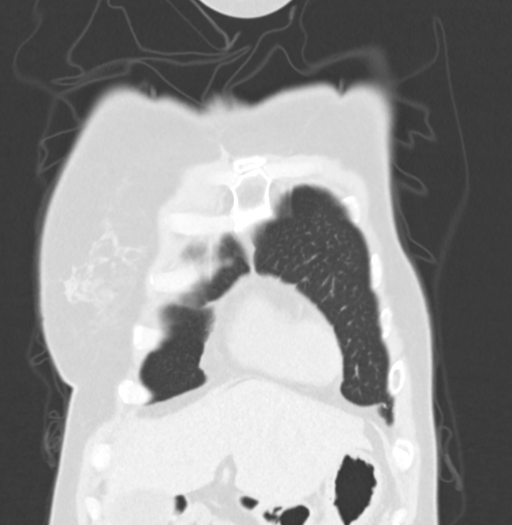
[im 59/146  lung]
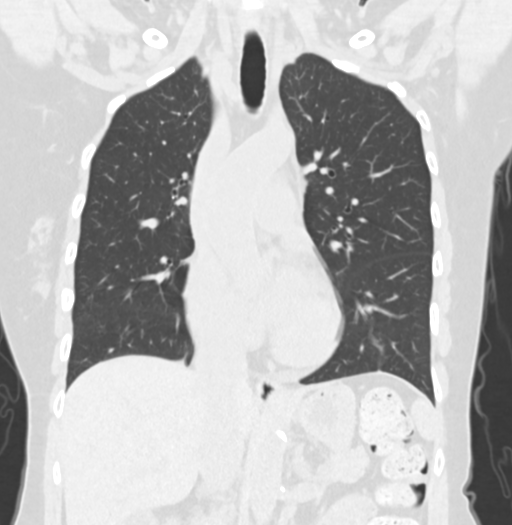
[im 88/146  lung]
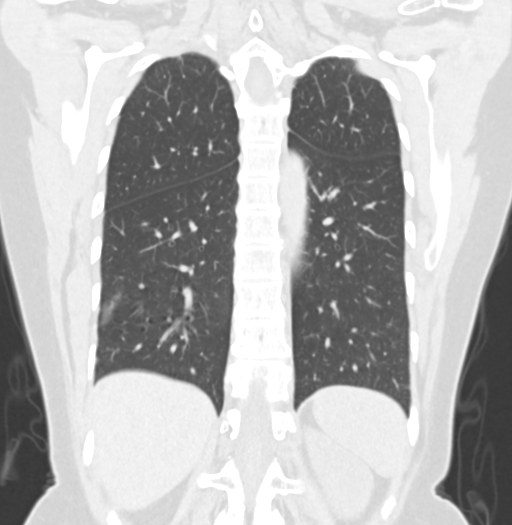

[Series 10: high res (id) thorax 1.00 ax · axial · 0.57mm/px · z∈[-1432,-1142]mm · 11 of 344 slices shown, 14 images]
[im 27/344  mediastinal]
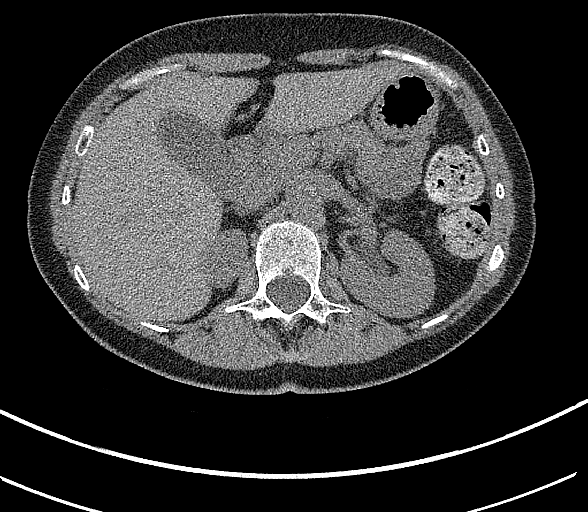
[im 27/344  lung]
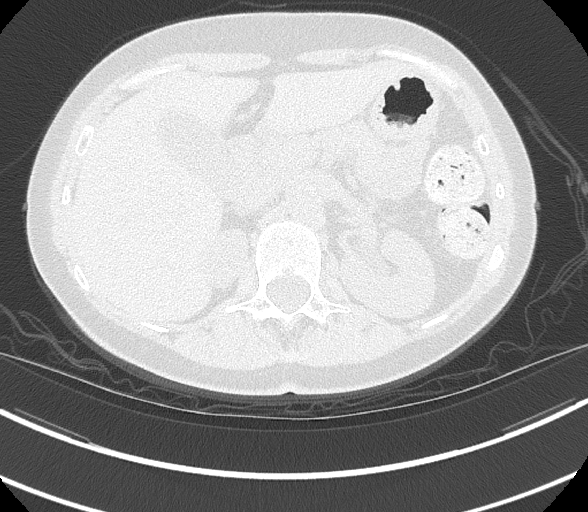
[im 53/344  lung]
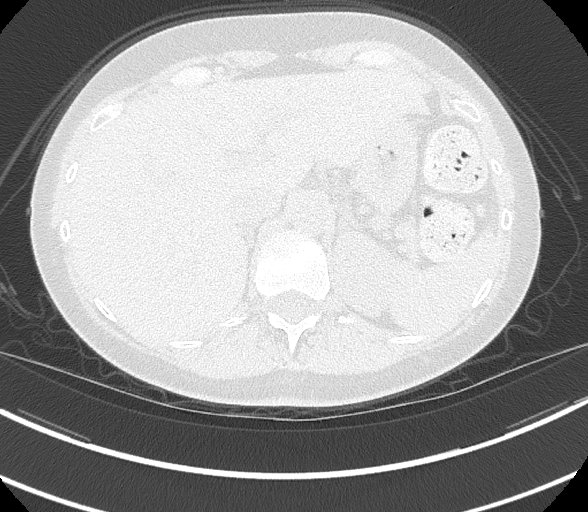
[im 80/344  lung]
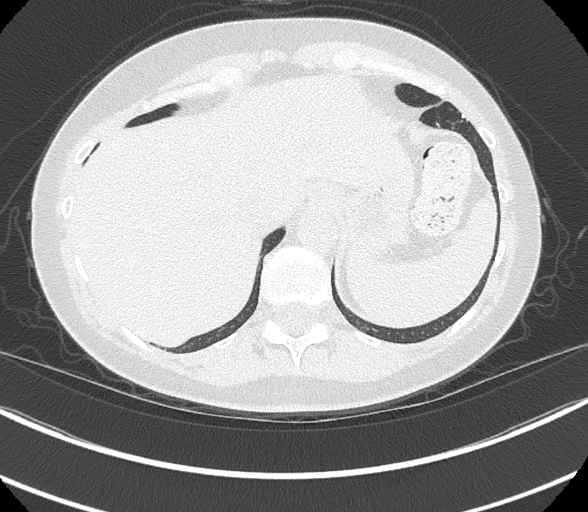
[im 106/344  lung]
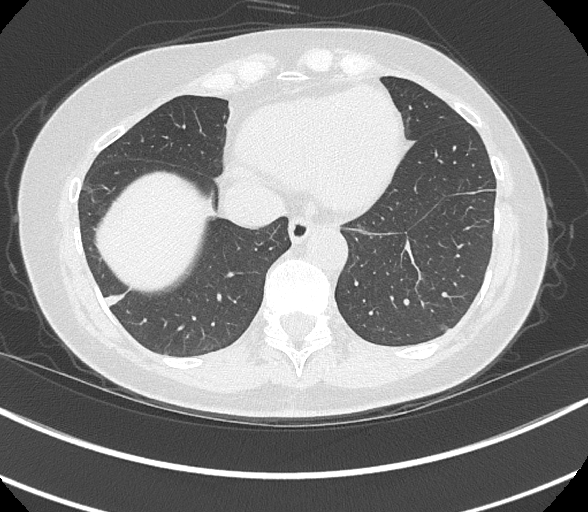
[im 132/344  mediastinal]
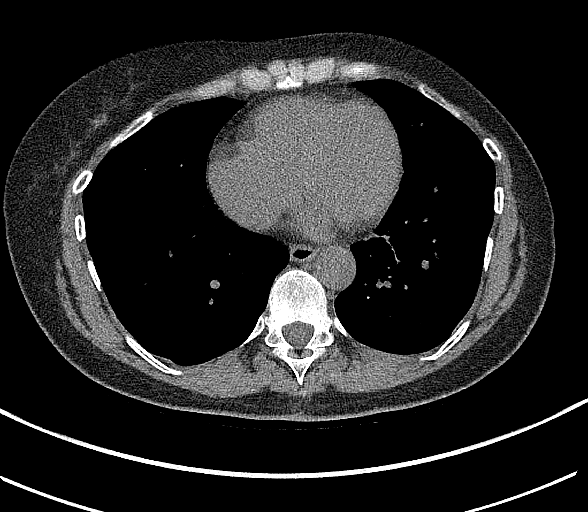
[im 132/344  lung]
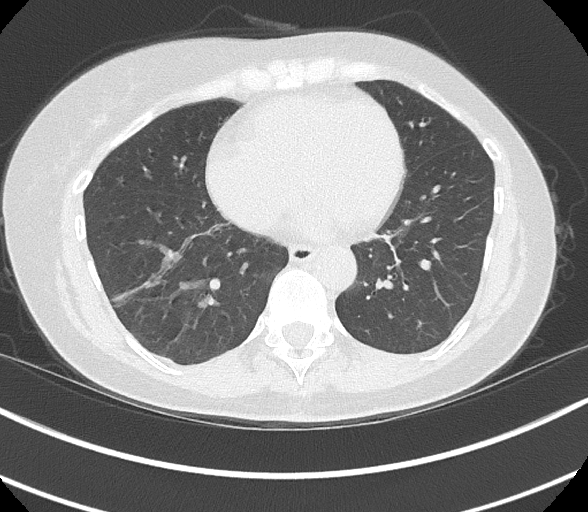
[im 185/344  lung]
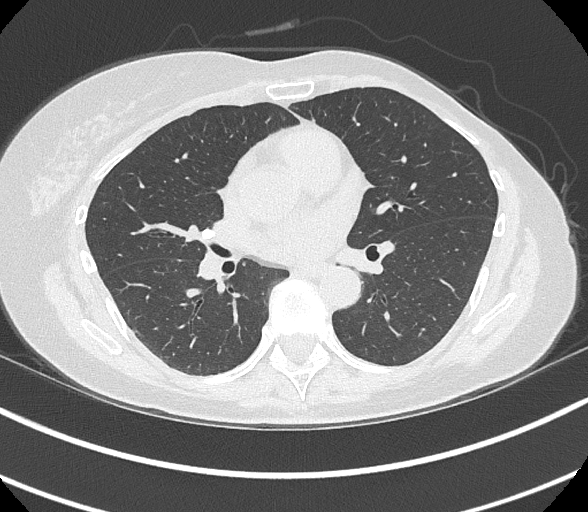
[im 212/344  lung]
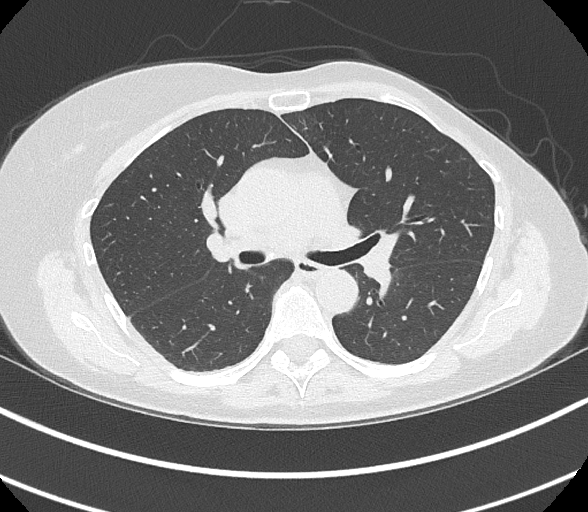
[im 238/344  lung]
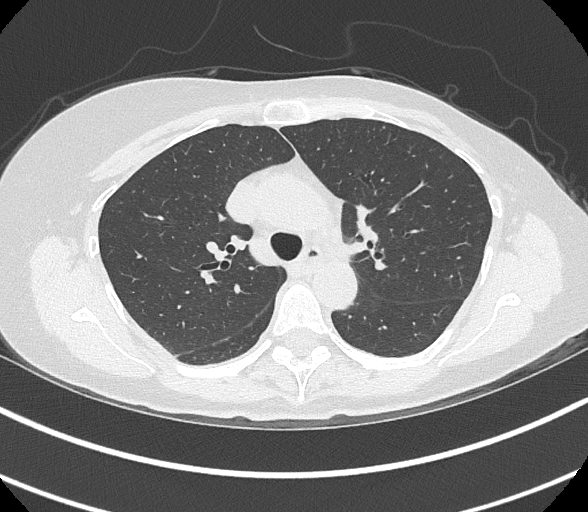
[im 264/344  mediastinal]
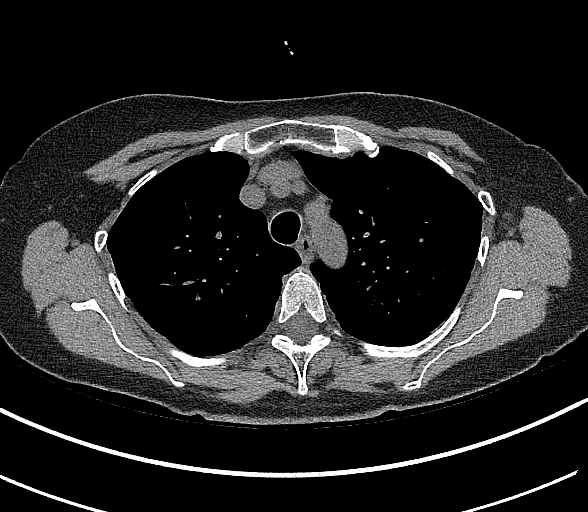
[im 264/344  lung]
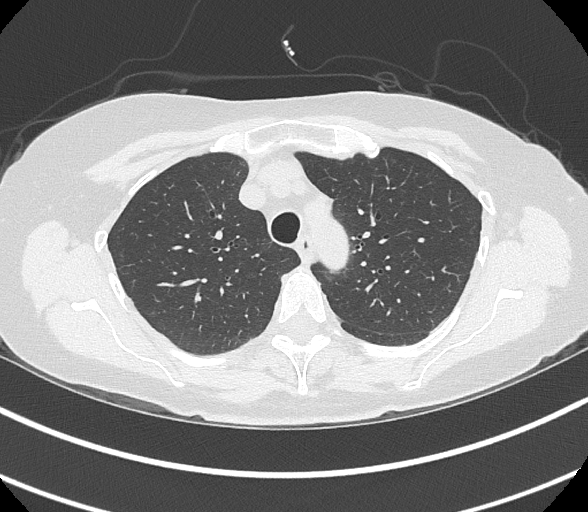
[im 291/344  lung]
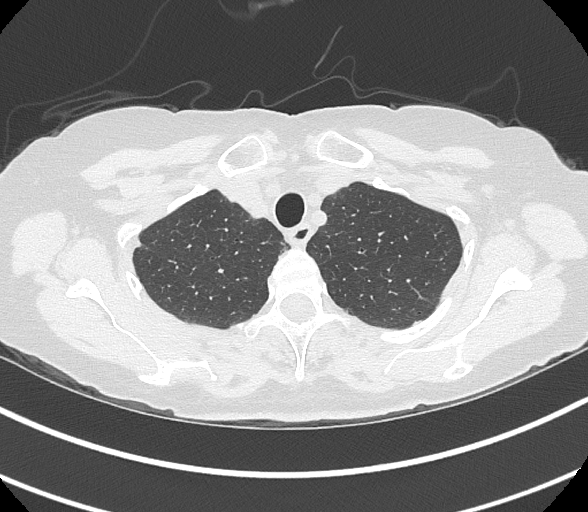
[im 317/344  lung]
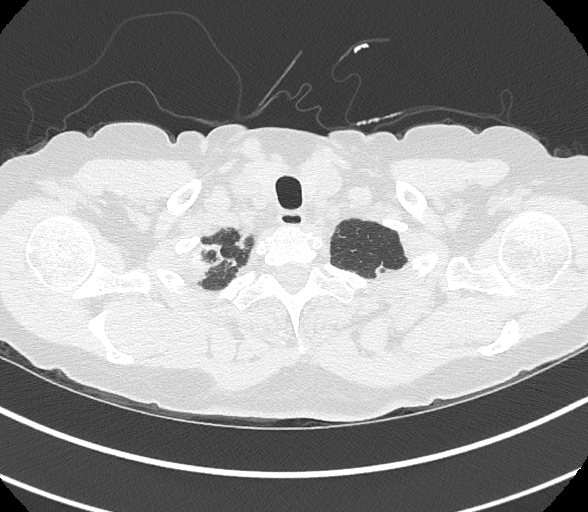

[14 of 36 positions shown; findings below may reference images not displayed]

FINDINGS: Cardiovascular: Normal heart size. No pericardial effusion. Mild
coronary artery calcifications of the LAD. Mild plaque the thoracic
aorta.

Mediastinum/Nodes: Mildly patulous esophagus. Thyroid is
unremarkable. Calcified right hilar lymph nodes. No pathologically
enlarged lymph nodes seen in the chest.

Lungs/Pleura: Central airways are patent. Mild bilateral air
trapping. Biapical pleuroparenchymal scarring. Linear opacities of
the bilateral lower lobes, likely due to scarring or atelectasis. No
architectural distortion, bronchiectasis or honeycomb change. No
consolidation, pleural effusion or pneumothorax. Solid pulmonary
nodule the right upper lobe measuring 3 mm on series 10, image 96.
Juxtapleural solid pulmonary nodule of the right upper lobe
measuring 4.5 mm in mean diameter.

Upper Abdomen: No acute abnormality.

Musculoskeletal: Nondisplaced fracture the right lateral 6 rib.
IMPRESSION: 1. No evidence of fibrotic interstitial lung disease.
2. Mild bilateral air trapping, which can be seen the setting of
small airways disease.
3. Small solid pulmonary nodules of the right upper lobe. No
follow-up needed if patient is low-risk (and has no known or
suspected primary neoplasm). Non-contrast chest CT can be considered
in 12 months if patient is high-risk. This recommendation follows
the consensus statement: Guidelines for Management of Incidental
Pulmonary Nodules Detected on CT Images: From the [HOSPITAL]
4. Mild coronary artery calcifications of the LAD.
5. Aortic Atherosclerosis (GRPGU-ES3.3).
6. Nondisplaced fracture the right lateral 6 rib.

## 2022-09-12 ENCOUNTER — Encounter (HOSPITAL_COMMUNITY): Payer: Self-pay

## 2022-09-12 ENCOUNTER — Ambulatory Visit (HOSPITAL_COMMUNITY): Admit: 2022-09-12 | Payer: 59 | Admitting: Gastroenterology

## 2022-09-12 SURGERY — MANOMETRY, ESOPHAGUS

## 2022-09-13 ENCOUNTER — Encounter: Payer: Self-pay | Admitting: Emergency Medicine

## 2022-09-13 ENCOUNTER — Emergency Department
Admission: EM | Admit: 2022-09-13 | Discharge: 2022-09-13 | Disposition: A | Discharge status: Home / Self Care | Payer: 59 | Attending: Emergency Medicine | Admitting: Emergency Medicine

## 2022-09-13 ENCOUNTER — Other Ambulatory Visit: Payer: Self-pay

## 2022-09-13 ENCOUNTER — Emergency Department: Payer: 59

## 2022-09-13 DIAGNOSIS — Z853 Personal history of malignant neoplasm of breast: Secondary | ICD-10-CM | POA: Insufficient documentation

## 2022-09-13 DIAGNOSIS — E860 Dehydration: Secondary | ICD-10-CM | POA: Diagnosis not present

## 2022-09-13 DIAGNOSIS — R197 Diarrhea, unspecified: Secondary | ICD-10-CM

## 2022-09-13 DIAGNOSIS — R110 Nausea: Secondary | ICD-10-CM | POA: Insufficient documentation

## 2022-09-13 LAB — CBC
HCT: 31.6 % — ABNORMAL LOW (ref 36.0–46.0)
Hemoglobin: 10.2 g/dL — ABNORMAL LOW (ref 12.0–15.0)
MCH: 29.8 pg (ref 26.0–34.0)
MCHC: 32.3 g/dL (ref 30.0–36.0)
MCV: 92.4 fL (ref 80.0–100.0)
Platelets: 205 10*3/uL (ref 150–400)
RBC: 3.42 MIL/uL — ABNORMAL LOW (ref 3.87–5.11)
RDW: 15.3 % (ref 11.5–15.5)
WBC: 4.9 10*3/uL (ref 4.0–10.5)
nRBC: 0 % (ref 0.0–0.2)

## 2022-09-13 LAB — COMPREHENSIVE METABOLIC PANEL
ALT: 8 U/L (ref 0–44)
AST: 15 U/L (ref 15–41)
Albumin: 3.9 g/dL (ref 3.5–5.0)
Alkaline Phosphatase: 32 U/L — ABNORMAL LOW (ref 38–126)
Anion gap: 5 (ref 5–15)
BUN: 15 mg/dL (ref 8–23)
CO2: 27 mmol/L (ref 22–32)
Calcium: 9.3 mg/dL (ref 8.9–10.3)
Chloride: 105 mmol/L (ref 98–111)
Creatinine, Ser: 0.81 mg/dL (ref 0.44–1.00)
GFR, Estimated: >60 mL/min (ref >60)
Glucose, Bld: 97 mg/dL (ref 70–99)
Potassium: 3.9 mmol/L (ref 3.5–5.1)
Sodium: 137 mmol/L (ref 135–145)
Total Bilirubin: 0.5 mg/dL (ref 0.3–1.2)
Total Protein: 6.8 g/dL (ref 6.5–8.1)

## 2022-09-13 MED ORDER — LACTATED RINGERS IV BOLUS
1000.0000 mL | Freq: Once | INTRAVENOUS | Status: AC
  Administered 2022-09-13: 1000 mL via INTRAVENOUS

## 2022-09-13 NOTE — ED Triage Notes (Addendum)
Pt sent from [facility] for hypotension. Was being seen there for flu-like illness with diarrhea for 4 days. Under tx for metastatic breast CA.  Pt was hypotensive on standing at the clinic and was sent here for fluids.  Neg for covid/flu/rsv today

## 2022-09-13 NOTE — ED Notes (Signed)
Pt able to drink and eat apple sauce without issue.

## 2022-09-13 NOTE — ED Provider Notes (Signed)
[facility] Provider Note    Event Date/Time   First MD Initiated Contact with Patient 09/13/22 1806     (approximate)   History   Chief Complaint Diarrhea and Hypotension   HPI  Naeemah Jasmer is a 61 y.o. female with past medical history of metastatic breast cancer and globus sensation who presents to the ED complaining of diarrhea.  Patient reports that she has had about 1 week of persistent watery stool and nausea with poor p.o. intake.  She states that she has been able to drink fluids without difficulty but has trouble with solids.  She denies any associated fevers or abdominal pain, but has been coughing with production of clear sputum.  She denies any pain in her chest or difficulty breathing.  She was initially evaluated at her PCPs office earlier today, found to have borderline low blood pressures and referred to the ED for further evaluation.  Testing for COVID, flu, and RSV was negative at her PCPs office prior to arrival.     Physical Exam   Triage Vital Signs: ED Triage Vitals [09/13/22 1712]  Enc Vitals Group     BP 106/60     Pulse Rate 61     Resp 18     Temp 98.3 F (36.8 C)     Temp Source Oral     SpO2 100 %     Weight      Height      Head Circumference      Peak Flow      Pain Score      Pain Loc      Pain Edu?      Excl. in [place]?     Most recent vital signs: Vitals:   09/13/22 1930 09/13/22 2000  BP: 128/75 127/66  Pulse: 66 66  Resp: 16 16  Temp:    SpO2: 100% 100%    Constitutional: Alert and oriented. Eyes: Conjunctivae are normal. Head: Atraumatic. Nose: No congestion/rhinnorhea. Mouth/Throat: Mucous membranes are dry.  Cardiovascular: Normal rate, regular rhythm. Grossly normal heart sounds.  2+ radial pulses bilaterally. Respiratory: Normal respiratory effort.  No retractions. Lungs CTAB. Gastrointestinal: Soft and nontender. No distention. Musculoskeletal: No lower extremity tenderness nor edema.   Neurologic:  Normal speech and language. No gross focal neurologic deficits are appreciated.    ED Results / Procedures / Treatments   Labs (all labs ordered are listed, but only abnormal results are displayed) Labs Reviewed  COMPREHENSIVE METABOLIC PANEL - Abnormal; Notable for the following components:      Result Value   Alkaline Phosphatase 32 (*)    All other components within normal limits  CBC - Abnormal; Notable for the following components:   RBC 3.42 (*)    Hemoglobin 10.2 (*)    HCT 31.6 (*)    All other components within normal limits     EKG  ED ECG REPORT I, Blake Divine, the attending physician, personally viewed and interpreted this ECG.   Date: 09/13/2022  EKG Time: 18:49  Rate: 65  Rhythm: normal sinus rhythm  Axis: Nomal  Intervals:none  ST&T Change: None  RADIOLOGY Chest x-ray reviewed and interpreted by me with no infiltrate, edema, or effusion.  PROCEDURES:  Critical Care performed: No  Procedures   MEDICATIONS ORDERED IN ED: Medications  lactated ringers bolus 1,000 mL (0 mLs Intravenous Stopped 09/13/22 1956)     IMPRESSION / MDM / ASSESSMENT AND PLAN / ED COURSE  I reviewed the  triage vital signs and the nursing notes.                              61 y.o. female with past medical history of metastatic breast cancer and globus sensation who presents to the ED complaining of cough and congestion with diarrhea for the past 3 to 4 days, was seen by her PCP earlier today and found to have borderline low blood pressure.  Patient's presentation is most consistent with acute presentation with potential threat to life or bodily function.  Differential diagnosis includes, but is not limited to, dehydration, electrolyte abnormality, AKI, sepsis, pneumonia, viral syndrome.  Patient well-appearing and in no acute distress, vital signs are unremarkable here in the ED, where patient is currently normotensive.  Labs are also reassuring with no  significant leukocytosis, electrolyte abnormality, or AKI.  LFTs are also unremarkable.  With reassuring labs and vital signs currently, I doubt sepsis and symptoms seem likely due to viral illness.  Testing for COVID-19, influenza, and RSV was reportedly negative prior to arrival, borderline low blood pressure likely due to dehydration.  We will hydrate with IV fluids and continue to monitor her blood pressure, also screen chest x-ray given her cough.  Chest x-ray is unremarkable, patient's blood pressure much improved following IV fluids and she reports feeling better with resolution of dizziness.  She is appropriate for discharge home with PCP follow-up, was counseled to take loperamide as needed for diarrhea.  She was counseled to return to the ED for new or worsening symptoms, patient agrees with plan.      FINAL CLINICAL IMPRESSION(S) / ED DIAGNOSES   Final diagnoses:  Dehydration  Diarrhea, unspecified type     Rx / DC Orders   ED Discharge Orders     None        Note:  This document was prepared using Dragon voice recognition software and may include unintentional dictation errors.   Blake Divine, MD 09/13/22 2033

## 2022-11-29 IMAGING — CT CT T SPINE W/O CM
3 of 4 series · 12 of 33 positions shown, 14 images · non-contrast
Comparison: CT chest 01/17/2022

CLINICAL DATA: Mid back pain primarily on the left side, tripped
yesterday

EXAM:
CT THORACIC SPINE WITHOUT CONTRAST
TECHNIQUE: Multidetector CT images of the thoracic were obtained using the
standard protocol without intravenous contrast.
RADIATION DOSE REDUCTION: This exam was performed according to the
departmental dose-optimization program which includes automated
exposure control, adjustment of the mA and/or kV according to
patient size and/or use of iterative reconstruction technique.

[Series 4: t spine soft · axial · 0.33mm/px · z∈[-792,-630]mm · 4 of 163 slices shown]
[im 28/163  soft-tissue]
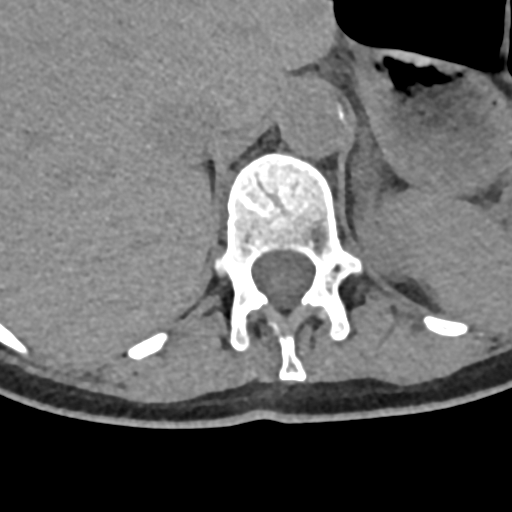
[im 55/163  soft-tissue]
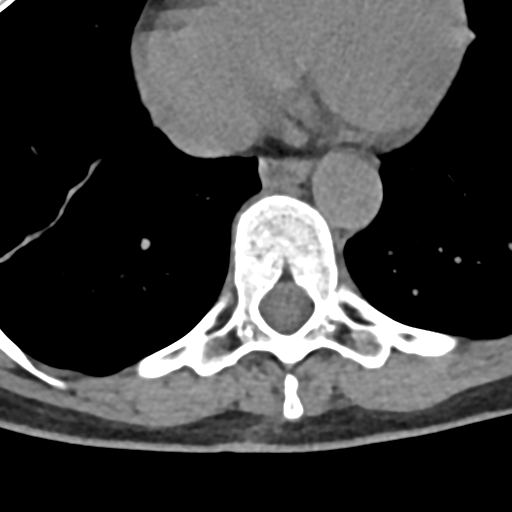
[im 82/163  soft-tissue]
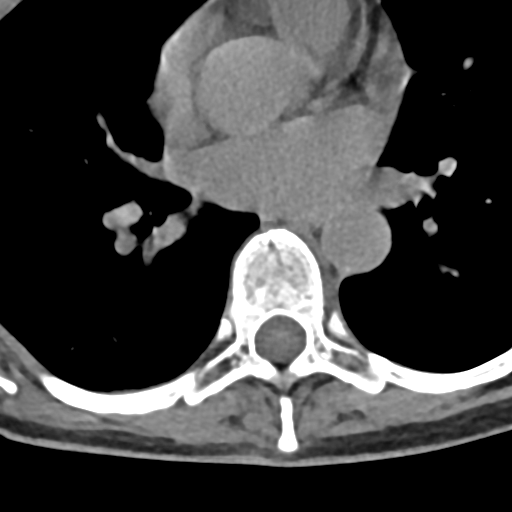
[im 109/163  soft-tissue]
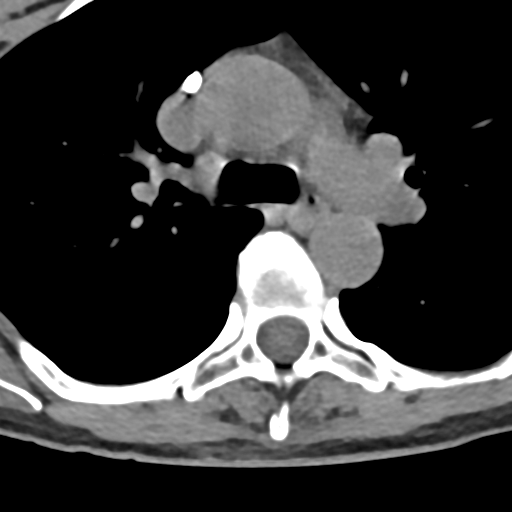

[Series 7: orthogonal bone · axial · 0.21mm/px · z∈[-787,-585]mm · 5 of 173 slices shown, 7 images]
[im 29/173  soft-tissue]
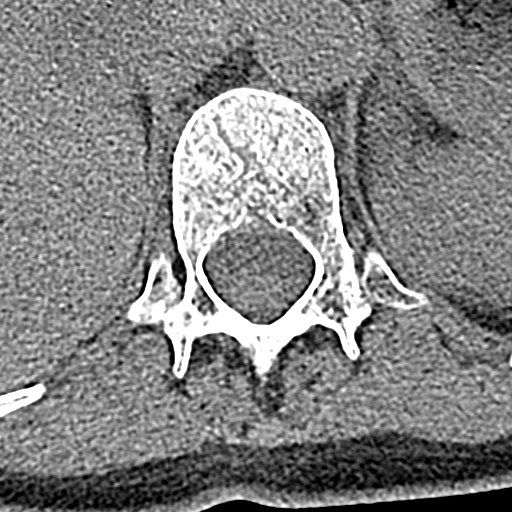
[im 29/173  bone]
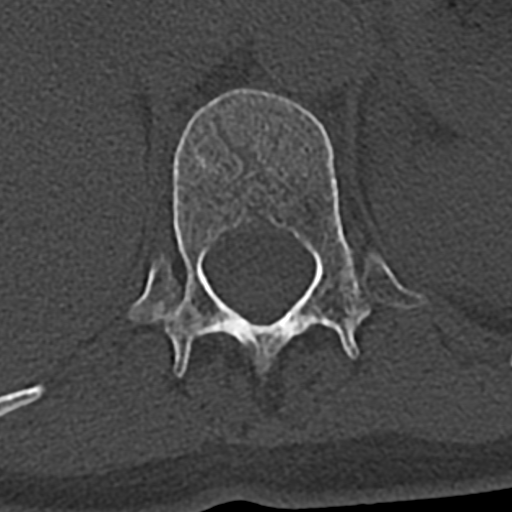
[im 58/173  bone]
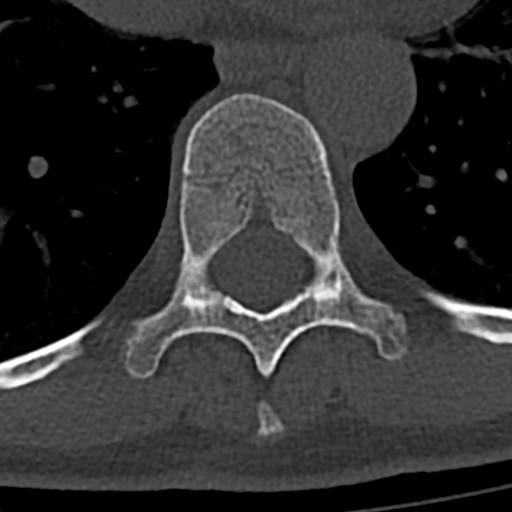
[im 87/173  bone]
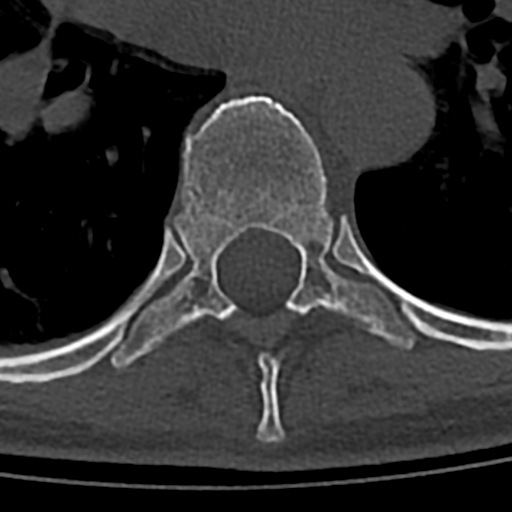
[im 115/173  bone]
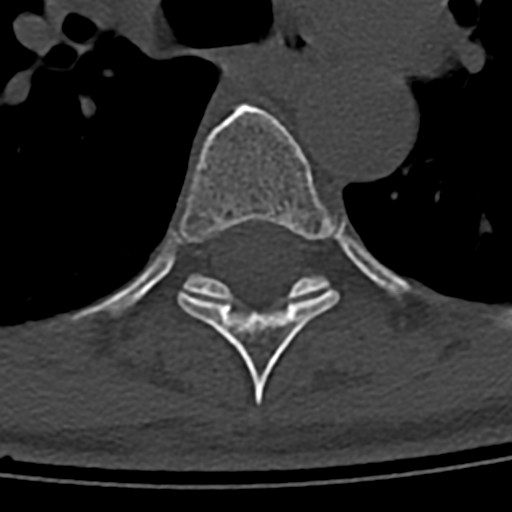
[im 144/173  soft-tissue]
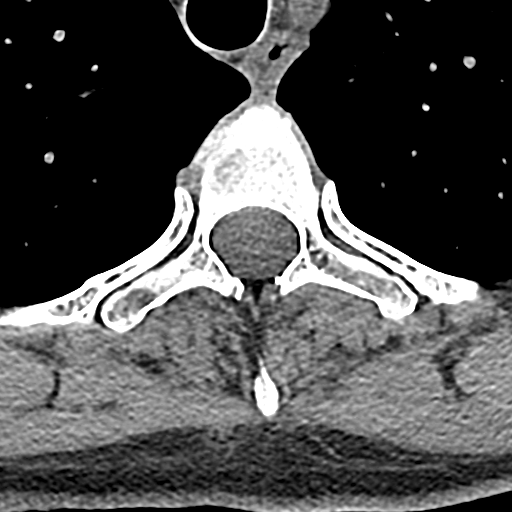
[im 144/173  bone]
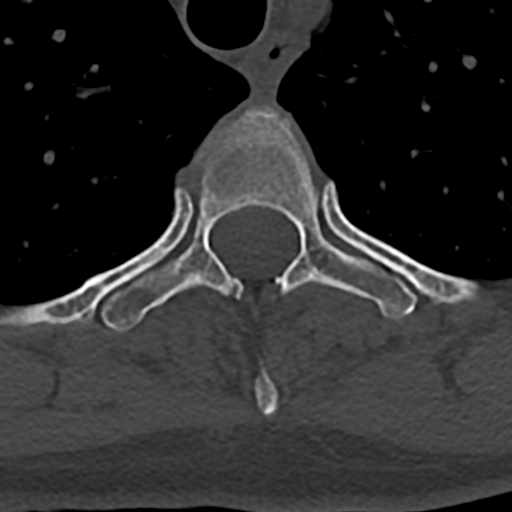

[Series 9: cor bone · coronal · 0.23mm/px · 3 of 71 slices shown]
[im 15/71  bone]
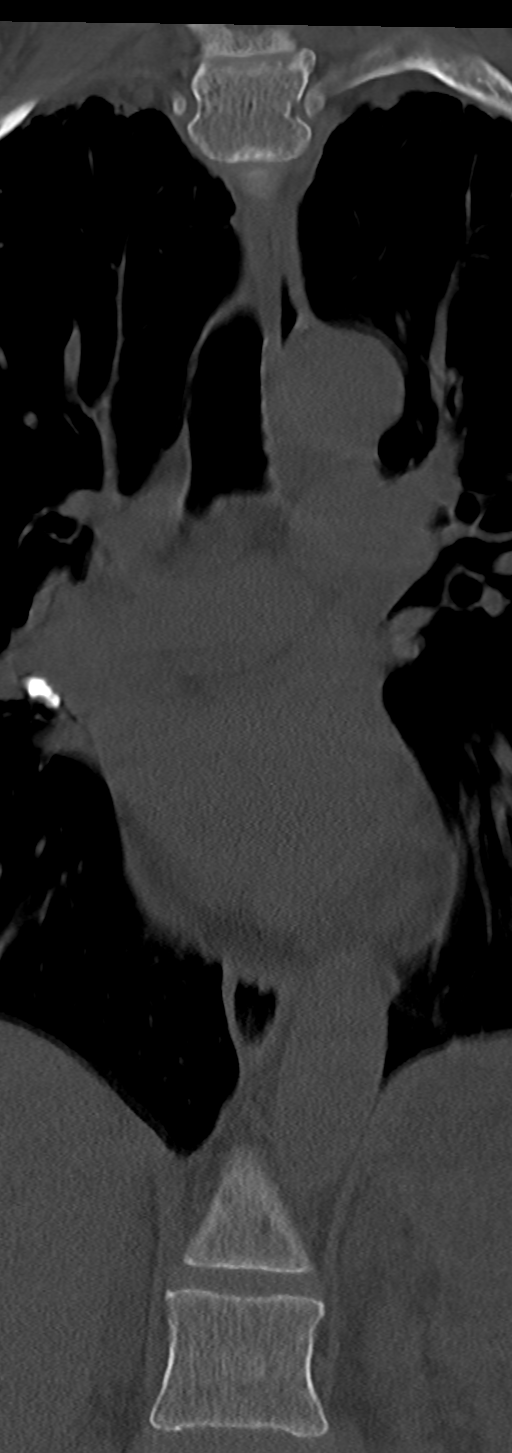
[im 29/71  bone]
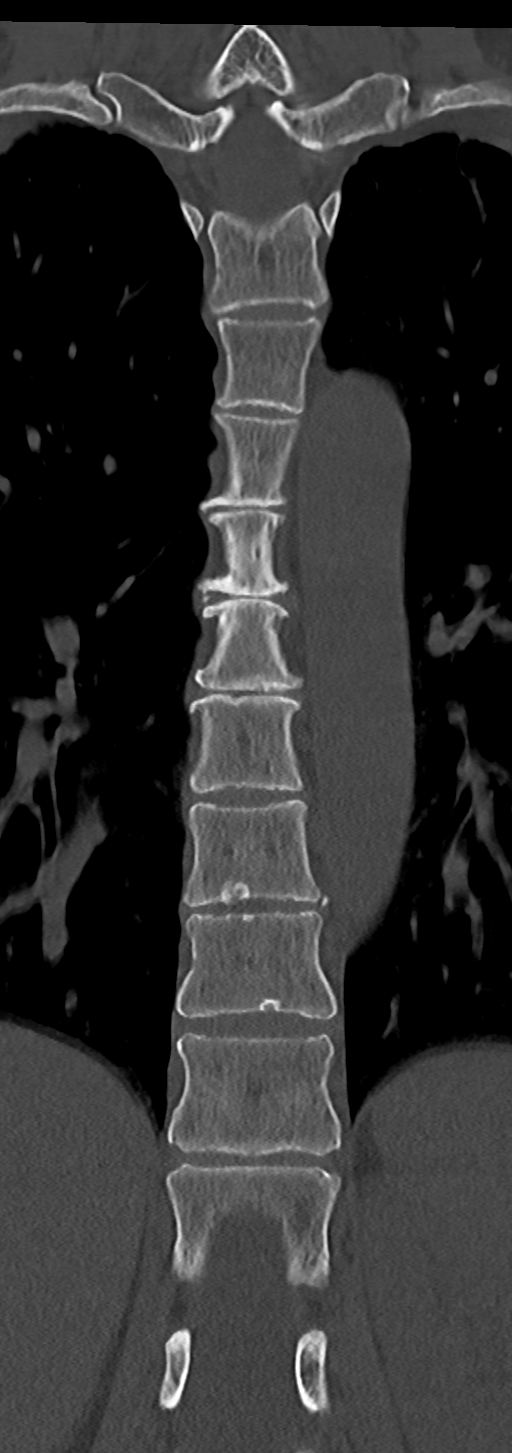
[im 43/71  bone]
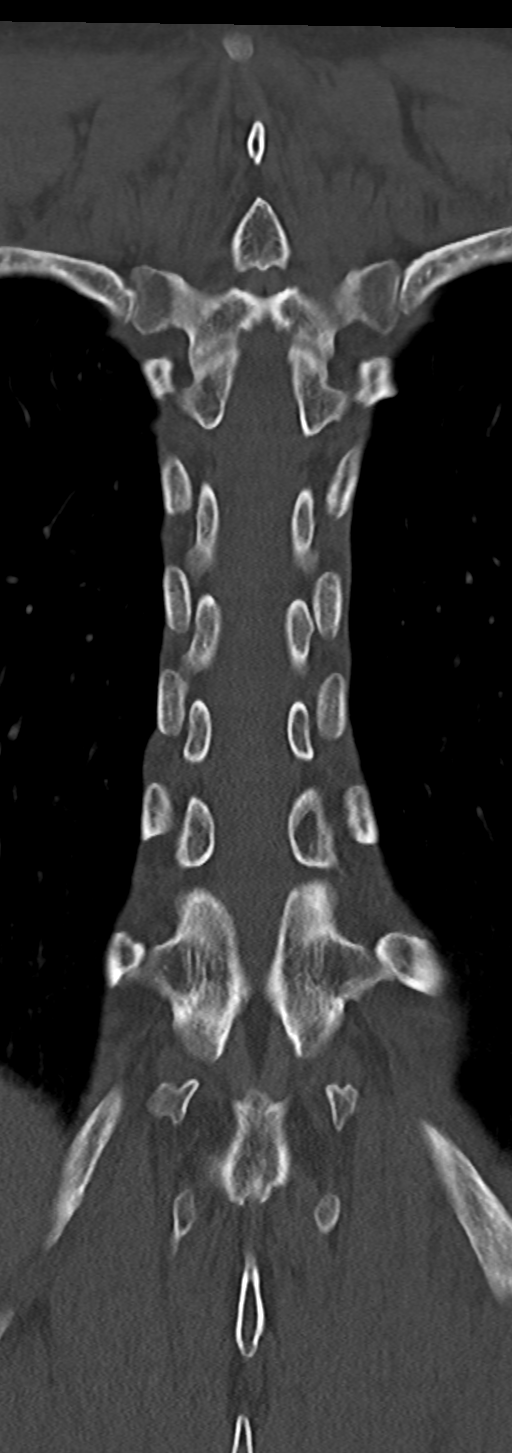

[12 of 33 positions shown; findings below may reference images not displayed]

FINDINGS: Alignment: Normal. There is no jumped or perched facets or other
evidence of traumatic malalignment.

Vertebrae: Vertebral body heights are preserved. There is no
evidence of acute fracture. There is no suspicious osseous lesion.

Paraspinal and other soft tissues: A calcified right hilar lymph
node is unchanged. Linear opacities in the right lung base are
unchanged, likely reflecting scar. The paraspinal soft tissues are
unremarkable.

Disc levels: There is mild disc space narrowing and degenerative
endplate change in the midthoracic spine. There is no evidence of
significant spinal canal or neural foraminal stenosis.
IMPRESSION: No acute fracture or traumatic malalignment of the thoracic spine.

## 2023-01-30 ENCOUNTER — Other Ambulatory Visit: Payer: Self-pay | Admitting: Primary Care

## 2023-01-30 DIAGNOSIS — G8918 Other acute postprocedural pain: Secondary | ICD-10-CM

## 2023-02-12 ENCOUNTER — Other Ambulatory Visit: Payer: Self-pay | Admitting: Pulmonary Disease

## 2023-05-24 ENCOUNTER — Emergency Department: Payer: 59

## 2023-05-24 ENCOUNTER — Other Ambulatory Visit: Payer: Self-pay

## 2023-05-24 ENCOUNTER — Emergency Department
Admission: EM | Admit: 2023-05-24 | Discharge: 2023-05-24 | Disposition: A | Discharge status: Home / Self Care | Payer: 59 | Attending: Emergency Medicine | Admitting: Emergency Medicine

## 2023-05-24 DIAGNOSIS — T189XXA Foreign body of alimentary tract, part unspecified, initial encounter: Secondary | ICD-10-CM | POA: Diagnosis present

## 2023-05-24 DIAGNOSIS — X58XXXA Exposure to other specified factors, initial encounter: Secondary | ICD-10-CM | POA: Insufficient documentation

## 2023-05-24 NOTE — ED Triage Notes (Signed)
Pt to ED for accidentally swallowing rubber foreign body while drinking smoothie this morning, feels like it is still stuck in throat. Has difficulty swallowing baseline d/t cancer hx.  Pt able to swallow saliva. RR Even and unlabored, NAD noted.

## 2023-05-24 NOTE — ED Provider Notes (Signed)
[facility] Provider Note    Event Date/Time   First MD Initiated Contact with Patient 05/24/23 1600     (approximate)   History   Swallowed Foreign Body   HPI  Alice Cherry is a 62 y.o. female with history of metastatic breast cancer and vocal cord paralysis who presents with a sensation of a foreign body in her throat.  The patient states that around 8:30 AM she made herself a smoothie.  She accidentally put a piece of the cup that she was using into the blender, and a piece of rubber gasket got caught up and ended up in her smoothie which she then swallowed.  The pieces of rubber were less than 1 cm in size.  She has had a sensation of a foreign body in the left side of her upper throat since that time.  It has not resolved despite trying to spit it up or drink water to push it down.  However, the patient has been able to drink without difficulty.  The patient has vocal cord paralysis after prior radiation treatment.  She normally speaks in a whispering voice and states that her voice is unchanged today.  She was told that she has an area in her throat that forms a pocket where things sometimes get stuck although usually if it is food it dissolves.  She feels like this is the area where the foreign body might be.  The patient states that she is able to swallow normally.  She denies any shortness of breath.  I reviewed the past medical records.  The patient was seen in the walk-in clinic today for the same symptoms and was referred to the ED for further evaluation due to concern for airway compromise.  Her most recent outpatient encounter was with oncology at [facility] on 7/1 for follow-up of her cancer.   Physical Exam   Triage Vital Signs: ED Triage Vitals  Enc Vitals Group     BP 05/24/23 1440 107/69     Pulse Rate 05/24/23 1440 68     Resp 05/24/23 1440 18     Temp 05/24/23 1440 98 F (36.7 C)     Temp Source 05/24/23 1440 Oral     SpO2 05/24/23 1440 99 %      Weight 05/24/23 1440 116 lb (52.6 kg)     Height 05/24/23 1440 5\' 8"  (1.727 m)     Head Circumference --      Peak Flow --      Pain Score 05/24/23 1444 0     Pain Loc --      Pain Edu? --      Excl. in [place]? --     Most recent vital signs: Vitals:   05/24/23 1440 05/24/23 1713  BP: 107/69   Pulse: 68   Resp: 18 18  Temp: 98 F (36.7 C)   SpO2: 99% 99%     General: Awake, no distress.  CV:  Good peripheral perfusion.  Resp:  Normal effort.  Abd:  No distention.  Other:  Oropharynx clear, no visible foreign body.  No stridor or pooled secretions.   ED Results / Procedures / Treatments   Labs (all labs ordered are listed, but only abnormal results are displayed) Labs Reviewed - No data to display   EKG     RADIOLOGY  Chest x-ray: I independently viewed and interpreted the images; there are no radiopaque foreign bodies   PROCEDURES:  Critical Care performed: No  Procedures   MEDICATIONS ORDERED IN ED: Medications - No data to display   IMPRESSION / MDM / ASSESSMENT AND PLAN / ED COURSE  I reviewed the triage vital signs and the nursing notes.  62 year old female with PMH as noted above presents with a foreign body sensation in her upper throat after accidentally swallowing a piece of rubber gasket from a cup.  She denies any other acute symptoms and is able to swallow normally.  Differential diagnosis includes, but is not limited to, ingested foreign body, globus sensation, vocal cord paralysis.  There is no evidence of esophageal foreign body impaction.  Chest x-ray shows no acute findings.  I will consult ENT for further recommendations.  Patient's presentation is most consistent with acute complicated illness / injury requiring diagnostic workup.  ----------------------------------------- 5:03 PM on 05/24/2023 -----------------------------------------  I consulted and discussed the case with Dr. Andee Poles from ENT.  He advises that if the patient is  eating and drinking normally, has no stridor or any clinical evidence of airway compromise, and appears well, there is no indication for urgent laryngoscopy.  He advises that the patient is appropriate for outpatient follow-up and likely has some chronic esophageal dysmotility and globus sensation.  I counseled the patient on ENT recommendations and she is in agreement.  She feels well and is comfortable going home.  She will follow-up as an outpatient.  I gave her strict return precautions and she expressed understanding.   FINAL CLINICAL IMPRESSION(S) / ED DIAGNOSES   Final diagnoses:  Swallowed foreign body, initial encounter     Rx / DC Orders   ED Discharge Orders     None        Note:  This document was prepared using Dragon voice recognition software and may include unintentional dictation errors.    Dionne Bucy, MD 05/25/23 0000

## 2023-05-24 NOTE — Discharge Instructions (Signed)
Return to the ER for new, worsening, or persistent severe discomfort in your throat, difficulty swallowing that is worse than your baseline, inability to hold anything down, change in your voice, shortness of breath, or any other new or worsening symptoms that concern you.  You may follow-up at [facility] next week or with your an ENT of your choice.

## 2023-05-24 NOTE — ED Notes (Signed)
Pt able to speak full sentences, airway patent.

## 2023-07-16 ENCOUNTER — Other Ambulatory Visit: Payer: Self-pay | Admitting: Primary Care

## 2023-07-16 DIAGNOSIS — G8918 Other acute postprocedural pain: Secondary | ICD-10-CM

## 2023-11-21 ENCOUNTER — Other Ambulatory Visit: Payer: Self-pay | Admitting: Pulmonary Disease
